# Patient Record
Sex: Female | Born: 1945 | Race: White | Hispanic: No | Smoking: Never smoker
Health system: Southern US, Community
[De-identification: ages and names within clinical notes are randomized; demographics above are authoritative.]

## PROBLEM LIST (undated history)

## (undated) DIAGNOSIS — F32A Depression, unspecified: Secondary | ICD-10-CM

## (undated) DIAGNOSIS — E538 Deficiency of other specified B group vitamins: Secondary | ICD-10-CM

## (undated) DIAGNOSIS — N289 Disorder of kidney and ureter, unspecified: Secondary | ICD-10-CM

## (undated) DIAGNOSIS — J45909 Unspecified asthma, uncomplicated: Secondary | ICD-10-CM

## (undated) DIAGNOSIS — E039 Hypothyroidism, unspecified: Secondary | ICD-10-CM

## (undated) DIAGNOSIS — M1990 Unspecified osteoarthritis, unspecified site: Secondary | ICD-10-CM

## (undated) DIAGNOSIS — F5089 Other specified eating disorder: Secondary | ICD-10-CM

## (undated) DIAGNOSIS — G5600 Carpal tunnel syndrome, unspecified upper limb: Secondary | ICD-10-CM

## (undated) DIAGNOSIS — E119 Type 2 diabetes mellitus without complications: Secondary | ICD-10-CM

## (undated) DIAGNOSIS — J189 Pneumonia, unspecified organism: Secondary | ICD-10-CM

## (undated) DIAGNOSIS — G4733 Obstructive sleep apnea (adult) (pediatric): Secondary | ICD-10-CM

## (undated) DIAGNOSIS — Z8719 Personal history of other diseases of the digestive system: Secondary | ICD-10-CM

## (undated) DIAGNOSIS — K644 Residual hemorrhoidal skin tags: Secondary | ICD-10-CM

## (undated) DIAGNOSIS — R011 Cardiac murmur, unspecified: Secondary | ICD-10-CM

## (undated) DIAGNOSIS — U071 COVID-19: Secondary | ICD-10-CM

## (undated) DIAGNOSIS — N189 Chronic kidney disease, unspecified: Secondary | ICD-10-CM

## (undated) DIAGNOSIS — Z9289 Personal history of other medical treatment: Secondary | ICD-10-CM

## (undated) DIAGNOSIS — I10 Essential (primary) hypertension: Secondary | ICD-10-CM

## (undated) DIAGNOSIS — D509 Iron deficiency anemia, unspecified: Secondary | ICD-10-CM

## (undated) DIAGNOSIS — E669 Obesity, unspecified: Secondary | ICD-10-CM

## (undated) DIAGNOSIS — F329 Major depressive disorder, single episode, unspecified: Secondary | ICD-10-CM

## (undated) DIAGNOSIS — K2970 Gastritis, unspecified, without bleeding: Secondary | ICD-10-CM

## (undated) DIAGNOSIS — E785 Hyperlipidemia, unspecified: Secondary | ICD-10-CM

## (undated) HISTORY — DX: Obstructive sleep apnea (adult) (pediatric): G47.33

## (undated) HISTORY — DX: Unspecified osteoarthritis, unspecified site: M19.90

## (undated) HISTORY — DX: Essential (primary) hypertension: I10

## (undated) HISTORY — PX: TONSILLECTOMY: SUR1361

## (undated) HISTORY — DX: Type 2 diabetes mellitus without complications: E11.9

## (undated) HISTORY — DX: Carpal tunnel syndrome, unspecified upper limb: G56.00

## (undated) HISTORY — DX: Iron deficiency anemia, unspecified: D50.9

## (undated) HISTORY — DX: Other specified eating disorder: F50.89

## (undated) HISTORY — PX: ABDOMINAL HYSTERECTOMY: SHX81

## (undated) HISTORY — DX: Hyperlipidemia, unspecified: E78.5

## (undated) HISTORY — DX: Disorder of kidney and ureter, unspecified: N28.9

## (undated) HISTORY — DX: Deficiency of other specified B group vitamins: E53.8

## (undated) HISTORY — PX: APPENDECTOMY: SHX54

## (undated) HISTORY — PX: HEMORRHOID SURGERY: SHX153

## (undated) HISTORY — PX: ADENOIDECTOMY: SUR15

## (undated) HISTORY — DX: Residual hemorrhoidal skin tags: K64.4

## (undated) HISTORY — DX: Major depressive disorder, single episode, unspecified: F32.9

## (undated) HISTORY — PX: ESOPHAGOGASTRODUODENOSCOPY: SHX1529

## (undated) HISTORY — DX: Obesity, unspecified: E66.9

## (undated) HISTORY — PX: COLONOSCOPY: SHX174

## (undated) HISTORY — DX: Gastritis, unspecified, without bleeding: K29.70

## (undated) HISTORY — DX: Depression, unspecified: F32.A

## (undated) HISTORY — DX: Personal history of other diseases of the digestive system: Z87.19

## (undated) HISTORY — DX: Hypothyroidism, unspecified: E03.9

---

## 2000-09-05 ENCOUNTER — Encounter: Admission: RE | Admit: 2000-09-05 | Discharge: 2000-12-04 | Payer: Self-pay | Admitting: Endocrinology

## 2003-07-02 ENCOUNTER — Other Ambulatory Visit: Admission: RE | Admit: 2003-07-02 | Discharge: 2003-07-02 | Payer: Self-pay | Admitting: Endocrinology

## 2005-09-28 ENCOUNTER — Other Ambulatory Visit: Admission: RE | Admit: 2005-09-28 | Discharge: 2005-09-28 | Payer: Self-pay | Admitting: Internal Medicine

## 2005-10-19 ENCOUNTER — Encounter: Admission: RE | Admit: 2005-10-19 | Discharge: 2005-10-19 | Payer: Self-pay | Admitting: Endocrinology

## 2005-11-01 ENCOUNTER — Ambulatory Visit (HOSPITAL_COMMUNITY): Admission: RE | Admit: 2005-11-01 | Discharge: 2005-11-01 | Payer: Self-pay | Admitting: *Deleted

## 2005-11-08 ENCOUNTER — Encounter: Admission: RE | Admit: 2005-11-08 | Discharge: 2005-11-08 | Payer: Self-pay | Admitting: Endocrinology

## 2006-07-13 ENCOUNTER — Encounter: Admission: RE | Admit: 2006-07-13 | Discharge: 2006-07-13 | Payer: Self-pay | Admitting: Endocrinology

## 2006-10-31 ENCOUNTER — Encounter: Admission: RE | Admit: 2006-10-31 | Discharge: 2006-10-31 | Payer: Self-pay | Admitting: Endocrinology

## 2006-11-16 ENCOUNTER — Other Ambulatory Visit: Admission: RE | Admit: 2006-11-16 | Discharge: 2006-11-16 | Payer: Self-pay | Admitting: Cardiology

## 2007-02-23 ENCOUNTER — Inpatient Hospital Stay (HOSPITAL_COMMUNITY): Admission: AD | Admit: 2007-02-23 | Discharge: 2007-02-23 | Payer: Self-pay | Admitting: Endocrinology

## 2007-03-14 ENCOUNTER — Ambulatory Visit (HOSPITAL_COMMUNITY): Admission: RE | Admit: 2007-03-14 | Discharge: 2007-03-14 | Payer: Self-pay | Admitting: *Deleted

## 2007-08-02 ENCOUNTER — Encounter (HOSPITAL_COMMUNITY): Admission: RE | Admit: 2007-08-02 | Discharge: 2007-08-02 | Payer: Self-pay | Admitting: Endocrinology

## 2007-08-02 ENCOUNTER — Observation Stay (HOSPITAL_COMMUNITY): Admission: AD | Admit: 2007-08-02 | Discharge: 2007-08-04 | Payer: Self-pay | Admitting: Endocrinology

## 2008-02-21 ENCOUNTER — Encounter (HOSPITAL_COMMUNITY): Admission: RE | Admit: 2008-02-21 | Discharge: 2008-05-06 | Payer: Self-pay | Admitting: Endocrinology

## 2008-03-19 ENCOUNTER — Encounter (INDEPENDENT_AMBULATORY_CARE_PROVIDER_SITE_OTHER): Payer: Self-pay | Admitting: *Deleted

## 2008-03-19 ENCOUNTER — Ambulatory Visit (HOSPITAL_COMMUNITY): Admission: RE | Admit: 2008-03-19 | Discharge: 2008-03-19 | Payer: Self-pay | Admitting: *Deleted

## 2008-09-27 DIAGNOSIS — Z8719 Personal history of other diseases of the digestive system: Secondary | ICD-10-CM

## 2008-09-27 HISTORY — DX: Personal history of other diseases of the digestive system: Z87.19

## 2008-10-09 ENCOUNTER — Inpatient Hospital Stay (HOSPITAL_COMMUNITY): Admission: EM | Admit: 2008-10-09 | Discharge: 2008-10-12 | Payer: Self-pay | Admitting: Emergency Medicine

## 2008-10-11 ENCOUNTER — Encounter (INDEPENDENT_AMBULATORY_CARE_PROVIDER_SITE_OTHER): Payer: Self-pay | Admitting: *Deleted

## 2008-10-12 ENCOUNTER — Ambulatory Visit: Payer: Self-pay | Admitting: Internal Medicine

## 2009-03-10 ENCOUNTER — Other Ambulatory Visit: Admission: RE | Admit: 2009-03-10 | Discharge: 2009-03-10 | Payer: Self-pay | Admitting: Internal Medicine

## 2009-10-11 ENCOUNTER — Encounter (INDEPENDENT_AMBULATORY_CARE_PROVIDER_SITE_OTHER): Payer: Self-pay | Admitting: *Deleted

## 2009-10-12 ENCOUNTER — Encounter (INDEPENDENT_AMBULATORY_CARE_PROVIDER_SITE_OTHER): Payer: Self-pay | Admitting: *Deleted

## 2010-05-18 ENCOUNTER — Encounter: Payer: Self-pay | Admitting: Gastroenterology

## 2010-05-20 ENCOUNTER — Encounter (INDEPENDENT_AMBULATORY_CARE_PROVIDER_SITE_OTHER): Payer: Self-pay | Admitting: *Deleted

## 2010-05-26 ENCOUNTER — Ambulatory Visit: Payer: Self-pay | Admitting: Oncology

## 2010-06-02 LAB — CBC & DIFF AND RETIC
BASO%: 0.4 % (ref 0.0–2.0)
Basophils Absolute: 0 10*3/uL (ref 0.0–0.1)
Eosinophils Absolute: 0.1 10*3/uL (ref 0.0–0.5)
LYMPH%: 25.4 % (ref 14.0–49.7)
MCHC: 29.6 g/dL — ABNORMAL LOW (ref 31.5–36.0)
MONO#: 0.6 10*3/uL (ref 0.1–0.9)
NEUT%: 62.1 % (ref 38.4–76.8)
Retic %: 2.21 % — ABNORMAL HIGH (ref 0.50–1.50)
Retic Ct Abs: 70.94 10*3/uL (ref 18.30–72.70)
lymph#: 1.5 10*3/uL (ref 0.9–3.3)

## 2010-06-02 LAB — MORPHOLOGY: PLT EST: INCREASED

## 2010-06-02 LAB — CHCC SMEAR

## 2010-06-03 LAB — COMPREHENSIVE METABOLIC PANEL
ALT: 19 U/L (ref 0–35)
Albumin: 4.1 g/dL (ref 3.5–5.2)
CO2: 21 mEq/L (ref 19–32)
Calcium: 9.7 mg/dL (ref 8.4–10.5)
Creatinine, Ser: 1.35 mg/dL — ABNORMAL HIGH (ref 0.40–1.20)
Total Bilirubin: 0.3 mg/dL (ref 0.3–1.2)
Total Protein: 6.5 g/dL (ref 6.0–8.3)

## 2010-06-03 LAB — IRON AND TIBC: TIBC: 391 ug/dL (ref 250–470)

## 2010-06-03 LAB — FERRITIN: Ferritin: 11 ng/mL (ref 10–291)

## 2010-06-12 LAB — FECAL OCCULT BLOOD, GUAIAC: Occult Blood: NEGATIVE

## 2010-07-09 ENCOUNTER — Ambulatory Visit: Payer: Self-pay | Admitting: Oncology

## 2010-07-13 ENCOUNTER — Encounter: Payer: Self-pay | Admitting: Gastroenterology

## 2010-07-13 LAB — CBC WITH DIFFERENTIAL/PLATELET
Basophils Absolute: 0 10*3/uL (ref 0.0–0.1)
Eosinophils Absolute: 0.2 10*3/uL (ref 0.0–0.5)
HCT: 30.2 % — ABNORMAL LOW (ref 34.8–46.6)
MCHC: 31.5 g/dL (ref 31.5–36.0)
MONO#: 0.6 10*3/uL (ref 0.1–0.9)
MONO%: 8.9 % (ref 0.0–14.0)
Platelets: ADEQUATE 10*3/uL (ref 145–400)
lymph#: 1.5 10*3/uL (ref 0.9–3.3)
nRBC: 0 % (ref 0–0)

## 2010-07-15 LAB — IRON AND TIBC
%SAT: 12 % — ABNORMAL LOW (ref 20–55)
TIBC: 298 ug/dL (ref 250–470)
UIBC: 261 ug/dL

## 2010-07-17 DIAGNOSIS — G4730 Sleep apnea, unspecified: Secondary | ICD-10-CM | POA: Insufficient documentation

## 2010-07-17 DIAGNOSIS — M1990 Unspecified osteoarthritis, unspecified site: Secondary | ICD-10-CM | POA: Insufficient documentation

## 2010-07-17 DIAGNOSIS — G5600 Carpal tunnel syndrome, unspecified upper limb: Secondary | ICD-10-CM

## 2010-07-17 DIAGNOSIS — D509 Iron deficiency anemia, unspecified: Secondary | ICD-10-CM | POA: Insufficient documentation

## 2010-07-17 DIAGNOSIS — E538 Deficiency of other specified B group vitamins: Secondary | ICD-10-CM

## 2010-07-17 DIAGNOSIS — K648 Other hemorrhoids: Secondary | ICD-10-CM | POA: Insufficient documentation

## 2010-07-17 DIAGNOSIS — E119 Type 2 diabetes mellitus without complications: Secondary | ICD-10-CM | POA: Insufficient documentation

## 2010-07-17 DIAGNOSIS — K922 Gastrointestinal hemorrhage, unspecified: Secondary | ICD-10-CM | POA: Insufficient documentation

## 2010-07-17 DIAGNOSIS — E039 Hypothyroidism, unspecified: Secondary | ICD-10-CM | POA: Insufficient documentation

## 2010-07-21 ENCOUNTER — Ambulatory Visit: Payer: Self-pay | Admitting: Gastroenterology

## 2010-07-21 DIAGNOSIS — I10 Essential (primary) hypertension: Secondary | ICD-10-CM | POA: Insufficient documentation

## 2010-07-21 DIAGNOSIS — K625 Hemorrhage of anus and rectum: Secondary | ICD-10-CM | POA: Insufficient documentation

## 2010-07-21 DIAGNOSIS — F329 Major depressive disorder, single episode, unspecified: Secondary | ICD-10-CM | POA: Insufficient documentation

## 2010-07-21 DIAGNOSIS — K644 Residual hemorrhoidal skin tags: Secondary | ICD-10-CM | POA: Insufficient documentation

## 2010-07-21 DIAGNOSIS — J45909 Unspecified asthma, uncomplicated: Secondary | ICD-10-CM | POA: Insufficient documentation

## 2010-07-21 DIAGNOSIS — F411 Generalized anxiety disorder: Secondary | ICD-10-CM | POA: Insufficient documentation

## 2010-07-21 DIAGNOSIS — E785 Hyperlipidemia, unspecified: Secondary | ICD-10-CM

## 2010-08-05 LAB — CBC WITH DIFFERENTIAL/PLATELET
Basophils Absolute: 0 10*3/uL (ref 0.0–0.1)
Eosinophils Absolute: 0.2 10*3/uL (ref 0.0–0.5)
HCT: 30.7 % — ABNORMAL LOW (ref 34.8–46.6)
MCH: 33.1 pg (ref 25.1–34.0)
MCV: 99.6 fL (ref 79.5–101.0)
MONO#: 0.7 10*3/uL (ref 0.1–0.9)
RBC: 3.08 10*6/uL — ABNORMAL LOW (ref 3.70–5.45)
RDW: 24.6 % — ABNORMAL HIGH (ref 11.2–14.5)
lymph#: 1.4 10*3/uL (ref 0.9–3.3)

## 2010-08-12 ENCOUNTER — Ambulatory Visit: Payer: Self-pay | Admitting: Oncology

## 2010-08-13 ENCOUNTER — Encounter: Payer: Self-pay | Admitting: Gastroenterology

## 2010-08-18 ENCOUNTER — Encounter: Payer: Self-pay | Admitting: Gastroenterology

## 2010-08-18 LAB — CBC WITH DIFFERENTIAL/PLATELET
Basophils Absolute: 0 10*3/uL (ref 0.0–0.1)
HGB: 11.8 g/dL (ref 11.6–15.9)
LYMPH%: 22.9 % (ref 14.0–49.7)
MCHC: 34.2 g/dL (ref 31.5–36.0)
MONO#: 0.8 10*3/uL (ref 0.1–0.9)
MONO%: 13.9 % (ref 0.0–14.0)
NEUT#: 3.2 10*3/uL (ref 1.5–6.5)
Platelets: 261 10*3/uL (ref 145–400)
RBC: 3.47 10*6/uL — ABNORMAL LOW (ref 3.70–5.45)
RDW: 19.3 % — ABNORMAL HIGH (ref 11.2–14.5)
WBC: 5.6 10*3/uL (ref 3.9–10.3)

## 2010-08-18 LAB — COMPREHENSIVE METABOLIC PANEL
ALT: 17 U/L (ref 0–35)
Albumin: 3.6 g/dL (ref 3.5–5.2)
Creatinine, Ser: 2.08 mg/dL — ABNORMAL HIGH (ref 0.40–1.20)
Glucose, Bld: 88 mg/dL (ref 70–99)
Potassium: 3.9 mEq/L (ref 3.5–5.3)
Total Protein: 6.7 g/dL (ref 6.0–8.3)

## 2010-08-19 LAB — IRON AND TIBC
TIBC: 247 ug/dL — ABNORMAL LOW (ref 250–470)
UIBC: 213 ug/dL

## 2010-08-19 LAB — FERRITIN: Ferritin: 232 ng/mL (ref 10–291)

## 2010-08-19 LAB — TRANSFERRIN RECEPTOR, SOLUABLE: Transferrin Receptor, Soluble: 30.6 nmol/L

## 2010-09-04 LAB — CBC WITH DIFFERENTIAL/PLATELET
Basophils Absolute: 0 10*3/uL (ref 0.0–0.1)
EOS%: 5.1 % (ref 0.0–7.0)
HCT: 35.2 % (ref 34.8–46.6)
HGB: 12.1 g/dL (ref 11.6–15.9)
LYMPH%: 20.9 % (ref 14.0–49.7)
MCH: 34.6 pg — ABNORMAL HIGH (ref 25.1–34.0)
MCHC: 34.5 g/dL (ref 31.5–36.0)
MONO#: 0.4 10*3/uL (ref 0.1–0.9)
NEUT%: 66.7 % (ref 38.4–76.8)
Platelets: 274 10*3/uL (ref 145–400)
RBC: 3.51 10*6/uL — ABNORMAL LOW (ref 3.70–5.45)
RDW: 17.4 % — ABNORMAL HIGH (ref 11.2–14.5)
lymph#: 1.2 10*3/uL (ref 0.9–3.3)

## 2010-09-04 LAB — FERRITIN: Ferritin: 272 ng/mL (ref 10–291)

## 2010-09-17 ENCOUNTER — Ambulatory Visit (HOSPITAL_COMMUNITY)
Admission: RE | Admit: 2010-09-17 | Discharge: 2010-09-18 | Payer: Self-pay | Source: Home / Self Care | Attending: General Surgery | Admitting: General Surgery

## 2010-09-18 ENCOUNTER — Ambulatory Visit: Payer: Self-pay | Admitting: Oncology

## 2010-09-23 LAB — CBC WITH DIFFERENTIAL/PLATELET
MCHC: 33 g/dL (ref 31.5–36.0)
MONO#: 0.6 10*3/uL (ref 0.1–0.9)
NEUT#: 7.5 10*3/uL — ABNORMAL HIGH (ref 1.5–6.5)
NEUT%: 77.1 % — ABNORMAL HIGH (ref 38.4–76.8)
RDW: 15.5 % — ABNORMAL HIGH (ref 11.2–14.5)
lymph#: 1.2 10*3/uL (ref 0.9–3.3)

## 2010-09-24 LAB — FERRITIN: Ferritin: 116 ng/mL (ref 10–291)

## 2010-10-08 ENCOUNTER — Encounter: Payer: Self-pay | Admitting: Gastroenterology

## 2010-10-09 ENCOUNTER — Encounter: Payer: Self-pay | Admitting: Gastroenterology

## 2010-10-09 LAB — COMPREHENSIVE METABOLIC PANEL
ALT: 20 U/L (ref 0–35)
AST: 18 U/L (ref 0–37)
Albumin: 3.6 g/dL (ref 3.5–5.2)
Alkaline Phosphatase: 65 U/L (ref 39–117)
BUN: 16 mg/dL (ref 6–23)
CO2: 29 mEq/L (ref 19–32)
Calcium: 9.5 mg/dL (ref 8.4–10.5)
Chloride: 106 mEq/L (ref 96–112)
Creatinine, Ser: 1.3 mg/dL — ABNORMAL HIGH (ref 0.40–1.20)
Glucose, Bld: 135 mg/dL — ABNORMAL HIGH (ref 70–99)
Potassium: 4.6 mEq/L (ref 3.5–5.3)
Sodium: 144 mEq/L (ref 135–145)
Total Bilirubin: 0.4 mg/dL (ref 0.3–1.2)
Total Protein: 6.6 g/dL (ref 6.0–8.3)

## 2010-10-09 LAB — IRON AND TIBC
%SAT: 26 % (ref 20–55)
Iron: 64 ug/dL (ref 42–145)
TIBC: 243 ug/dL — ABNORMAL LOW (ref 250–470)
UIBC: 179 ug/dL

## 2010-10-09 LAB — CBC WITH DIFFERENTIAL/PLATELET
BASO%: 0.5 % (ref 0.0–2.0)
Basophils Absolute: 0 10*3/uL (ref 0.0–0.1)
EOS%: 2.7 % (ref 0.0–7.0)
Eosinophils Absolute: 0.2 10*3/uL (ref 0.0–0.5)
HCT: 36.7 % (ref 34.8–46.6)
HGB: 12.6 g/dL (ref 11.6–15.9)
LYMPH%: 21.5 % (ref 14.0–49.7)
MCH: 34.1 pg — ABNORMAL HIGH (ref 25.1–34.0)
MCHC: 34.3 g/dL (ref 31.5–36.0)
MCV: 99.5 fL (ref 79.5–101.0)
MONO#: 0.5 10*3/uL (ref 0.1–0.9)
MONO%: 7.1 % (ref 0.0–14.0)
NEUT#: 5 10*3/uL (ref 1.5–6.5)
NEUT%: 68.2 % (ref 38.4–76.8)
Platelets: 352 10*3/uL (ref 145–400)
RBC: 3.69 10*6/uL — ABNORMAL LOW (ref 3.70–5.45)
RDW: 14.6 % — ABNORMAL HIGH (ref 11.2–14.5)
WBC: 7.3 10*3/uL (ref 3.9–10.3)
lymph#: 1.6 10*3/uL (ref 0.9–3.3)

## 2010-10-09 LAB — FERRITIN: Ferritin: 91 ng/mL (ref 10–291)

## 2010-10-09 LAB — LACTATE DEHYDROGENASE: LDH: 131 U/L (ref 94–250)

## 2010-10-27 NOTE — Letter (Signed)
Summary: New Patient letter  [facility]  [address] [place], [place] [address]   Phone: [phone]  Fax: [phone]       05/20/2010 MRN: [id number]  [name],   [id number]  Dear [name],  Welcome to the Gastroenterology Division at [facility].    You are scheduled to see Dr. [name] on 07/21/2010 at 2:00pm on the 3rd floor at [facility] [place] [facility].  We ask that you try to arrive at our office 15 minutes prior to your appointment time to allow for check-in.  We would like you to complete the enclosed self-administered evaluation form prior to your visit and bring it with you on the day of your appointment.  We will review it with you.  Also, please bring a complete list of all your medications or, if you prefer, bring the medication bottles and we will list them.  Please bring your insurance card so that we may make a copy of it.  If your insurance requires a referral to see a specialist, please bring your referral form from your primary care physician.  Co-payments are due at the time of your visit and may be paid by cash, check or credit card.     Your office visit will consist of a consult with your physician (includes a physical exam), any laboratory testing he/she may order, scheduling of any necessary diagnostic testing (e.g. x-ray, ultrasound, CT-scan), and scheduling of a procedure (e.g. Endoscopy, Colonoscopy) if required.  Please allow enough time on your schedule to allow for any/all of these possibilities.    If you cannot keep your appointment, please call [phone] to cancel or reschedule prior to your appointment date.  This allows [place] the opportunity to schedule an appointment for another patient in need of care.  If you do not cancel or reschedule by 5 p.m. the business day prior to your appointment date, you will be charged a $50.00 late cancellation/no-show fee.    Thank you for choosing  [facility] for your medical needs.  We appreciate the opportunity to care for you.  Please visit [place] at our website  to learn more about our practice.                     Sincerely,                                                             The Gastroenterology Division

## 2010-10-27 NOTE — Procedures (Signed)
Summary: Colonoscopy & Pathology   Colonoscopy  Procedure date:  10/11/2008  Findings:      Location:  [facility].   NAME:  [name], [name]               ACCOUNT NO.:  [id number]      MEDICAL RECORD NO.:  [id number]          PATIENT TYPE:  INP      LOCATION:  5118                         FACILITY:  [facility]      PHYSICIAN:  Waverly Ferrari, M.D.    DATE OF BIRTH:  1945/10/05      DATE OF PROCEDURE:  10/09/2008   DATE OF DISCHARGE:                                  OPERATIVE REPORT      PROCEDURE:  Colonoscopy.      INDICATION:  Rectal bleeding with anemia.      ANESTHESIA:  Fentanyl 15 mcg, Versed 5 mg.      PROCEDURE:  With the patient mildly sedated in the left lateral   decubitus position, the rectal exam was performed.  It was unremarkable.   Subsequently, a Pentax videoscopic colonoscope was inserted in the   rectum and passed under direct vision with pressure applied.  The   patient rolled to her back, we were able to reach the cecum identified   by base of cecum and ileocecal valve, both of which were photographed.   From this point, the colonoscope was slowly withdrawn, taken   circumferential views of colonic mucosa and stopping to photograph.   White patches seen scattered throughout the colon in the mucosa and   biopsied these areas in multiple places as well.  The endoscope was   withdrawn and all the way to the rectum which appeared normal in direct   and retroflex view.  Pullthrough the anal canal, however, showed that   she had significant external hemorrhoids that were bleeding.   Photographs taken.  The patient's vital signs and pulse oximetry   remained stable.  The patient tolerated the procedure well without   apparent complications.      FINDINGS:  White patches in colonic mucosa, etiology which is unclear.   Biopsies taken and had significant external hemorrhoids that were   bleeding and quite possibly could be the cause of the patient's blood   loss.      PLAN:  We will await biopsy report.  If the patient is stable, may be   discharged with outpatient followup, will probably need an external   hemorrhoidectomy.                  ______________________________   Waverly Ferrari, M.D.            GMO/MEDQ  D:  10/11/2008  T:  10/12/2008  Job:  [id number]    SP-Surgical Pathology - STATUS: Final  .                                         Perform Date: [id number] 00:01  Ordered By: Macario Golds,  Ordered Date: [id number] 15:53  Facility: [facility]                              Department: [place]  Service Report Text  [facility]   [address]   [place], [place] [address]   [phone]    REPORT OF SURGICAL PATHOLOGY    Case #: [id number]   Patient Name: [name].   Office Chart Number: N/A    MRN: [id number]   Pathologist: Neldon Mc, MD   DOB/Age 11-06-1945 (Age: 65) Gender: F   Date Taken: 10/11/2008   Date Received: 10/11/2008    FINAL DIAGNOSIS    ***MICROSCOPIC EXAMINATION AND DIAGNOSIS***    1. STOMACH: BENIGN GASTRIC MUCOSA WITH SLIGHT CHRONIC   INFLAMMATION. NO HELICOBACTER PYLORI, INTESTINAL METAPLASIA, OR   EVIDENCE OF MALIGNANCY IDENTIFIED (BIOPSIES, ANTRUM).    2. COLON: BENIGN COLORECTAL MUCOSA WITH MINIMAL ACTIVE   INFLAMMATION AND FEATURES OF MELANOSIS COLI (BIOPSIES,   ASCENDING).    COMMENT   1. A Warthin-Starry stain is performed to determine the   possibility of the presence of Helicobacter pylori. The   Warthin-Starry stain is negative for organisms of Helicobacter   pylori. The control(s) stained appropriately. There is benign   gastric mucosa with sparse chronic inflammation within the lamina   propria. No active inflammation, intestinal metaplasia or   evidence of malignancy is seen.    2. There are fragments of benign colorectal mucosa in which   there is prominent melanosis coli present. There is mild to   moderate chronic inflammation within  the lamina propria   associated with a rare crypt containing an eosinophil or   neutrophil within crypt epithelium consistent with minimal   cryptitis. I suspect the findings are nonspecific since they are   minimal and no other active inflammation is seen. There are   hyperplastic mucosal lymphoid aggregates present. No adenomatous   change or evidence of malignancy is seen. (JAS:kv 10-14-08)    kv   Date Reported: 10/14/2008 Neldon Mc, MD   *** Electronically Signed Out By JAS ***    Clinical information   (lmb)    specimen(s) obtained   1: Stomach, biopsy, antral   2: Colon, biopsy, ascending    Gross Description   1. Received in formalin are tan, soft tissue fragments that are   submitted in toto. Number: 2 Size: 0.2 cm   Block summary: toto in cassette    2. Received in formalin are four 0.1 to 0.2 cm fragments of   soft, tan material. Block summary: toto in cassette   (BM:jes,10/14/08)    jes/   Additional Information  HL7 RESULT STATUS : F  External IF Update Timestamp : 2008-10-11:15:57:00.000000

## 2010-10-27 NOTE — Letter (Signed)
Summary: New Patient letter  [facility]  [address] [place], [place] [address]   Phone: [phone]  Fax: [phone]       05/18/2010 MRN: [id number]  [place], [place]  [address]  Dear Ms. Makris,  Welcome to the Gastroenterology Division at [facility].    You are scheduled to see Dr.  [name] on 07-14-10 at 9am on the 3rd floor at [facility] [place] [facility].  We ask that you try to arrive at our office 15 minutes prior to your appointment time to allow for check-in.  We would like you to complete the enclosed self-administered evaluation form prior to your visit and bring it with you on the day of your appointment.  We will review it with you.  Also, please bring a complete list of all your medications or, if you prefer, bring the medication bottles and we will list them.  Please bring your insurance card so that we may make a copy of it.  If your insurance requires a referral to see a specialist, please bring your referral form from your primary care physician.  Co-payments are due at the time of your visit and may be paid by cash, check or credit card.     Your office visit will consist of a consult with your physician (includes a physical exam), any laboratory testing he/she may order, scheduling of any necessary diagnostic testing (e.g. x-ray, ultrasound, CT-scan), and scheduling of a procedure (e.g. Endoscopy, Colonoscopy) if required.  Please allow enough time on your schedule to allow for any/all of these possibilities.    If you cannot keep your appointment, please call [phone] to cancel or reschedule prior to your appointment date.  This allows [place] the opportunity to schedule an appointment for another patient in need of care.  If you do not cancel or reschedule by 5 p.m. the business day prior to your appointment date, you will be charged a $50.00 late cancellation/no-show fee.    Thank you for choosing [facility] for your medical needs.  We appreciate the opportunity to care for you.  Please visit [place] at our website  to learn more about our practice.                     Sincerely,                                                             The Gastroenterology Division

## 2010-10-27 NOTE — Discharge Summary (Signed)
NAMEBRAVE, MUNI               ACCOUNT NO.:  [id number]      MEDICAL RECORD NO.:  [id number]          PATIENT TYPE:  INP      LOCATION:  5118                         FACILITY:  [facility]      PHYSICIAN:  Cherene Altes, M.D.DATE OF BIRTH:  [birth year]      DATE OF ADMISSION:  10/09/2008   DATE OF DISCHARGE:  10/12/2008                                  DISCHARGE SUMMARY      PRIMARY CARE PHYSICIAN:  Ronaldo Miyamoto, M.D.      GI PHYSICIAN:  Waverly Ferrari, M.D.      DISCHARGE DIAGNOSES:   1. Acute/chronic blood loss anemia.       a.     Hemoglobin 6.8 at presentation.       b.     Status post transfusion of 3 units of packed red blood        cells.       c.     Hemoglobin 9.4 at discharge.   2. Gastrointestinal bleeding.       a.     Status post esophagogastroduodenoscopy and colonoscopy.       b.     External hemorrhoids which were actively bleeding noted on        colonoscopy.       c.     Gastric and colonic biopsies pending at discharge.   3. Severe B12 deficiency.       a.     Newly-diagnosed.       b.     B12 186 during hospital stay.       c.     Subcutaneous B12 administered during hospital stay.       d.     Outpatient trial of oral B12 with recommendation for recheck        of B12 level in 7-10 days.   4. Diabetes mellitus.   5. Osteoarthritis.   6. Carpal tunnel syndrome.   7. obstructive sleep apnea.   8. Hypothyroidism.   9. Status post hysterectomy.   10.Status post appendectomy.   11.Status post tonsillectomy and adenoidectomy.      DISCHARGE MEDICATIONS:   1. Benicar 40/25 p.o. daily.   2. Detrol 4 mg p.o. daily.   3. Metformin 1000 mg p.o. b.i.d.   4. Vytorin 10/40 p.o. daily.   5. Synthroid 100 mcg p.o. daily   6. Januvia 100 mg p.o. daily   7. Ranitidine 150 mg p.o. daily or Protonix 40 mg p.o. daily or       omeprazole 20 mg p.o. daily.   8. MiraLax or Benaprotein fiber supplement.   9. Iron sulfate 325 mg p.o. b.i.d. with food.   10.Vitamin B12  1000 mcg tablets 2 tablets daily until further notice.      FOLLOWUP:   1. The patient is instructed to call Dr. Shan Levans office for biopsy       results and for further followup recommendations.   2. The patient is advised to follow up with Dr. Wilson Singer in 7-10 days.  At that time a B12 level should be assessed to determine if oral       replacement therapy will suffice for the patient's B12 deficiency.       It is quite possible that she suffers with a malabsorption problem       and will require frequent injections.      CONSULTATION:  Dr. Jim Desanctis with gastroenterology.      PROCEDURE:   1. EGD October 10, 2008:  Gastric biopsies but no acute abnormalities       appreciated.   2. Colonoscopy October 10, 2008:  Multiple white patches throughout       the colon with external bleeding hemorrhoids.  White patches were       biopsied with results pending.      HOSPITAL COURSE:  Ms. Arliss Koep is a very pleasant 65 year old   female who presented with a history of severe weakness.  Workup in the   emergency room included a CBC which revealed a hemoglobin of 6.8.  The   patient was found to be guaiac-positive.  The patient was admitted to   the acute units.  She was transfused a total of 3 units of packed red   blood cells.  Gastroenterology consultation was carried out.  The   patient underwent a colonoscopy and EGD with the results as noted above.   As part of the patient's evaluation, a routine screening B12 was also   performed.  This returned remarkably low as noted above.  Subcu B12   injections at 1000 mcg per day were initiated for total of 2 doses   during the patient's hospital stay.  At the time of discharge it is felt   reasonable to attempt an outpatient oral treatment regimen for B12   deficiency as described above.  It is recognized the patient may be   suffering with a malabsorption issue and if the patient's B12 is not   sufficiently elevated within 10-14 days max,  it is likely that recurrent   outpatient B12 injections will be required.  In regard to the patient's   anemia, her blood count responded nicely to her transfusion.  Her   hemoglobin reached a peak of approximately 9.4 and remained stable   through the remainder of her hospitalization.  Colonoscopy did reveal   evidence of external hemorrhoids and this is felt to likely be the   primary cause of the patient's blood loss anemia.  A number of white   patches were noted on the colonoscopy and these were sent for   pathology.  The results are pending and the patient is instructed to   call Dr. Lajoyce Corners for a final diagnosis.  Otherwise, biopsies were also made   of the gastric mucosa and these too are pending at the present time.   Nonetheless, on October 12, 2008, the patient was tolerating a regular   diet without difficulty.  She was ambulating around her room reported   that her severe weakness was markedly improved.  She was therefore   cleared for discharge home with the above-listed followup   recommendations.               Cherene Altes, M.D.   Electronically Signed            JTM/MEDQ  D:  10/12/2008  T:  10/12/2008  Job:  [id number]      cc:   Ronaldo Miyamoto, M.D.   Ortencia Kick.  Lajoyce Corners, M.D.

## 2010-10-27 NOTE — Procedures (Signed)
Summary: Endoscopy & Pathology   EGD  Procedure date:  10/11/2009  Findings:      Location: [facility]   NAME:  [name], [name]               ACCOUNT NO.:  [id number]      MEDICAL RECORD NO.:  [id number]          PATIENT TYPE:  INP      LOCATION:  5118                         FACILITY:  [facility]      PHYSICIAN:  Waverly Ferrari, M.D.    DATE OF BIRTH:  1945/12/28      DATE OF PROCEDURE:   DATE OF DISCHARGE:                                  OPERATIVE REPORT      PROCEDURE:  Upper endoscopy.      INDICATIONS:  GI bleeding.      ANESTHESIA:  Fentanyl, 100 mcg.  Versed 10 mg.      DESCRIPTION OF PROCEDURE:  With the patient mildly sedated in the left   lateral decubitus position, the Pentax videoscopic endoscope was   inserted in the mouth and passed under direct vision through the   esophagus, which appeared normal in the stomach, fundus body, antrum,   duodenal bulb, and second portion of duodenum were visualized.  From   this point, the endoscope was slowly withdrawn, taking circumferential   views of duodenal mucosa until the endoscope had been pulled back into   the stomach, placed in retroflexion to view the stomach from below, and   then the cardia of the stomach near the GE junction, is a linear area of   gastritis which was seen, photographed, and biopsied.  There might have   been some flecks of blood in the stomach, accounted for possible some of   the patient's blood loss, but not acutely.  The endoscope was then   straightened and withdrawn, taking circumferentially with the remaining   gastric and esophageal mucosa.  The patient's vital signs and pulse   oximetry remained stable.  The patient tolerated the procedure well   without apparent complications.      FINDINGS:  Local area of gastris in the fundus of the stomach, biopsied.   Await biopsy report.  The patient will call me for results and followup   with me as an outpatient.      PROCEDURE:   Colonoscopy as planned.                  ______________________________   Waverly Ferrari, M.D.            GMO/MEDQ  D:  10/11/2008  T:  10/12/2008  Job:  [id number]    SP-Surgical Pathology - STATUS: Final  .                                         Perform Date: [id number] 00:01  Ordered By: Macario Golds,            Ordered Date: [phone] 15:53  Facility: [facility]  Department: CPATH  Service Report Text  [facility]   [address]   [place], [place] [address]   [phone]    REPORT OF SURGICAL PATHOLOGY    Case #: [id number]   Patient Name: [name], [name].   Office Chart Number: N/A    MRN: [id number]   Pathologist: Neldon Mc, MD   DOB/Age 13-Dec-1945 (Age: 65) Gender: F   Date Taken: 10/11/2008   Date Received: 10/11/2008    FINAL DIAGNOSIS    ***MICROSCOPIC EXAMINATION AND DIAGNOSIS***    1. STOMACH: BENIGN GASTRIC MUCOSA WITH SLIGHT CHRONIC   INFLAMMATION. NO HELICOBACTER PYLORI, INTESTINAL METAPLASIA, OR   EVIDENCE OF MALIGNANCY IDENTIFIED (BIOPSIES, ANTRUM).    2. COLON: BENIGN COLORECTAL MUCOSA WITH MINIMAL ACTIVE   INFLAMMATION AND FEATURES OF MELANOSIS COLI (BIOPSIES,   ASCENDING).    COMMENT   1. A Warthin-Starry stain is performed to determine the   possibility of the presence of Helicobacter pylori. The   Warthin-Starry stain is negative for organisms of Helicobacter   pylori. The control(s) stained appropriately. There is benign   gastric mucosa with sparse chronic inflammation within the lamina   propria. No active inflammation, intestinal metaplasia or   evidence of malignancy is seen.    2. There are fragments of benign colorectal mucosa in which   there is prominent melanosis coli present. There is mild to   moderate chronic inflammation within the lamina propria   associated with a rare crypt containing an eosinophil or   neutrophil within crypt epithelium consistent with minimal    cryptitis. I suspect the findings are nonspecific since they are   minimal and no other active inflammation is seen. There are   hyperplastic mucosal lymphoid aggregates present. No adenomatous   change or evidence of malignancy is seen. (JAS:kv 10-14-08)    kv   Date Reported: 10/14/2008 Neldon Mc, MD   *** Electronically Signed Out By JAS ***    Clinical information   (lmb)    specimen(s) obtained   1: Stomach, biopsy, antral   2: Colon, biopsy, ascending    Gross Description   1. Received in formalin are tan, soft tissue fragments that are   submitted in toto. Number: 2 Size: 0.2 cm   Block summary: toto in cassette    2. Received in formalin are four 0.1 to 0.2 cm fragments of   soft, tan material. Block summary: toto in cassette   (BM:jes,10/14/08)    jes/   Additional Information  HL7 RESULT STATUS : F  External IF Update Timestamp : 2008-10-11:15:57:00.000000

## 2010-10-27 NOTE — Letter (Signed)
Summary: [place]   Imported By: Phillis Knack 07/27/2010 09:15:14  _____________________________________________________________________  External Attachment:    Type:   Image     Comment:   External Document

## 2010-10-27 NOTE — Letter (Signed)
Summary: [name]   Imported By: Phillis Knack 08/27/2010 14:22:13  _____________________________________________________________________  External Attachment:    Type:   Image     Comment:   External Document

## 2010-10-27 NOTE — Assessment & Plan Note (Signed)
Summary: ANEMIA/YF   History of Present Illness Visit Type: consult Primary GI MD: Verl Blalock MD [place] Primary Provider: Anda Kraft, MD Chief Complaint: anemia, pt has had iron infusions and transfusions.  She is seeing Dr. Ralene Ok who has prescribed weekly iron infusions through November History of Present Illness:   Extremity Pleasant 65 year old Caucasian female with recurrent and persistent rectal bleeding with 2 colonoscopies and endoscopies previously by Dr.Orr which were reviewed today and were entirely normal except for evidence of melanosis coli. She has continued bright red blood per rectum despite no constipation, abdominal rectal pain. She had excision of a thrombosed hemorrhoid greater than 10 years ago by Dr. Rebekah Chesterfield. She gives no history of chronic malabsorption. She currently is on regular iron infusions every other week by Dr. Ralene Ok because of her refractory bleeding and iron deficiency. Family history is noncontributory.   GI Review of Systems    Reports acid reflux, bloating, and  weight loss.      Denies abdominal pain, belching, chest pain, dysphagia with liquids, dysphagia with solids, heartburn, loss of appetite, nausea, vomiting, vomiting blood, and  weight gain.      Reports constipation, hemorrhoids, and  rectal bleeding.     Denies anal fissure, black tarry stools, change in bowel habit, diarrhea, diverticulosis, fecal incontinence, heme positive stool, irritable bowel syndrome, jaundice, light color stool, liver problems, and  rectal pain. Preventive Screening-Counseling & Management  Alcohol-Tobacco     Smoking Status: quit      Drug Use:  no.      Current Medications (verified): 1)  Detrol La 4 Mg Xr24h-Cap (Tolterodine Tartrate) .... Take One By Mouth Once Daily 2)  Losartan Potassium-Hctz 50-12.5 Mg Tabs (Losartan Potassium-Hctz) .... Take 1 Tablet By Mouth Two Times A Day 3)  Januvia 100 Mg Tabs (Sitagliptin Phosphate) .... Take One By Mouth  Once Daily 4)  Metformin Hcl 500 Mg Tabs (Metformin Hcl) .... Take Four Tabs By Mouth Every Morning 5)  Vitamin B-12 1000 Mcg Tabs (Cyanocobalamin) .... Take One By Mouth Once Daily 6)  Ranitidine Hcl 150 Mg Caps (Ranitidine Hcl) .... Take One Op Once Daily 7)  Diphenhydramine Hcl 25 Mg Caps (Diphenhydramine Hcl) .... Take One By Mouth Two Times A Day 8)  Tylenol Extra Strength 500 Mg Tabs (Acetaminophen) .... Take 2 Tablets By Mouth Every Morning Then As Needed 9)  Senna 8.6 Mg Tabs (Sennosides) .... Take 9 Tablets After Every Meal 10)  Synthroid 100 Mcg Tabs (Levothyroxine Sodium) .... Take One By Mouth Once Daily 11)  Infed 50 Mg/ml Soln (Iron Dextran) .... Infuse Weekly-On Friday- Through November  Allergies (verified): No Known Drug Allergies  Past History:  Past medical, surgical, family and social histories (including risk factors) reviewed for relevance to current acute and chronic problems.  Past Medical History: Current Problems:  HYPERTENSION (ICD-401.9) HYPERLIPIDEMIA (ICD-272.4) DEPRESSION (ICD-311) ASTHMA (ICD-493.90) ANXIETY (ICD-300.00) SLEEP APNEA (ICD-780.57) CARPAL TUNNEL SYNDROME, BILATERAL (ICD-354.0) OSTEOARTHRITIS (ICD-715.90) VITAMIN B12 DEFICIENCY (ICD-266.2) HEMORRHOIDS, INTERNAL (ICD-455.0) GASTROINTESTINAL HEMORRHAGE (ICD-578.9) ANEMIA, IRON DEFICIENCY (ICD-280.9) HYPOTHYROIDISM (ICD-244.9) DIABETES MELLITUS (ICD-250.00)  Past Surgical History: Appendectomy Hysterectomy Tonsillectomy adenoidectomy Thyroid Nodules removed  Family History: Reviewed history and no changes required. No FH of Colon Cancer: Family History of Heart Disease: Mother's side of family  Social History: Reviewed history and no changes required. 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  Alcohol Use - no Illicit Drug Use - no Smoking Status:  quit Drug Use:  no  Review of Systems       The patient complains of arthritis/joint pain,  back pain, fatigue, muscle pains/cramps, shortness of breath, sleeping problems, swelling of feet/legs, and vision changes.  The patient denies allergy/sinus, anemia, anxiety-new, blood in urine, breast changes/lumps, confusion, cough, coughing up blood, depression-new, fainting, fever, headaches-new, hearing problems, heart murmur, heart rhythm changes, itching, menstrual pain, night sweats, nosebleeds, pregnancy symptoms, skin rash, sore throat, swollen lymph glands, thirst - excessive, urination - excessive, urination changes/pain, urine leakage, and voice change.    Vital Signs:  Patient profile:   65 year old female Height:      69 inches Weight:      275 pounds BMI:     40.76 Pulse rate:   84 / minute Pulse rhythm:   regular BP sitting:   100 / 56  (left arm) Cuff size:   large  Vitals Entered By: Abelino Derrick CMA Deborra Medina) (July 21, 2010 2:06 PM)  Physical Exam  General:  Well developed, well nourished, no acute distress.obese.   Head:  Normocephalic and atraumatic. Eyes:  PERRLA, no icterus. Lungs:  Clear throughout to auscultation. Heart:  Regular rate and rhythm; no murmurs, rubs,  or bruits. Abdomen:  Soft, nontender and nondistended. No masses, hepatosplenomegaly or hernias noted. Normal bowel sounds.obese.   Rectal:  Large 3 quadrant hemorrhoids present without fissures or fistulae. Rectal exam does not show masses or tenderness. There is no blood or stool in the rectal vault for testing. Extremities:  No clubbing, cyanosis, edema or deformities noted. Neurologic:  Alert and  oriented x4;  grossly normal neurologically. Psych:  Alert and cooperative. Normal mood and affect.   Impression & Recommendations:  Problem # 1:  HEMORRHOIDS, EXTERNAL (ICD-455.3) Assessment Deteriorated I am surprised she has not been referred for hemorrhoidectomy previously. She has continued refractory iron deficiency with continued rectal bleeding from her hemorrhoids, and otherwise 2  negative colonoscopies. I referred her to Dr. Greer Pickerel at [facility] surgery for consideration of hemorrhoidectomy. She can use p.r.n. Analpram cream, but I doubt this will make a difference in her bleeding. She is not on anticoagulants. Celiac panel has been ordered to exclude associated iron malabsorption. Review of her extensive records shows that she was previously B12 deficient but is on parenteral replacement therapy. I see no need to repeat her colonoscopy or endoscopy at this time. Orders: T-Sprue Panel (Celiac Disease Aby Eval) ([id number]) T-igA [phone]) [facility] ([facility])  Problem # 2:  RECTAL BLEEDING (ICD-569.3) Assessment: Deteriorated  Orders: T-Sprue Panel (Celiac Disease Aby Eval) ([id number]) T-igA ([id number]) [facility] ([facility])  Patient Instructions: 1)  Copy sent to : Anda Kraft, MD & Greer Pickerel, MD 2)  Your prescription(s) have been sent to you pharmacy.  3)  Your appt with Dr. Greer Pickerel is scheduled for 08/13/2010, arrive at 1:30pm 4)  Please go to the basement today for your labs.  5)  The medication list was reviewed and reconciled.  All changed / newly prescribed medications were explained.  A complete medication list was provided to the patient / caregiver. 6)  Constipation and Hemorrhoids brochure given.  Prescriptions: HYDROCORTISONE ACE-PRAMOXINE 2.5-1 % CREA (HYDROCORTISONE ACE-PRAMOXINE) use as needed  #1 tube x 1   Entered by:   Bernita Buffy CMA ([place])   Authorized by:   Sable Feil MD [facility]   Signed by:   Bernita Buffy CMA ([place]) on 07/21/2010   Method used:   Electronically to  [address] [phone]* (retail)       [place], [place]  [address]       Ph: [phone]       Fax: [phone]   RxID:   [id number]   Appended Document: ANEMIA/YF copoy Dr.Murinson in oncology  Appended Document: ANEMIA/YF biscomed  Appended Document: ANEMIA/YF copy Dr.  Santiago Bur at the [facility]: ANEMIA/YF biscommed

## 2010-10-29 NOTE — Consult Note (Signed)
Summary: [facility]   Imported By: Rise Patience 09/09/2010 16:10:07  _____________________________________________________________________  External Attachment:    Type:   Image     Comment:   External Document

## 2010-11-04 NOTE — Letter (Signed)
Summary: [facility]   Imported By: Phillis Knack 10/30/2010 11:07:27  _____________________________________________________________________  External Attachment:    Type:   Image     Comment:   External Document

## 2010-11-04 NOTE — Letter (Signed)
Summary: Gayland Curry MD/[facility]   Imported By: Bubba Hales 10/28/2010 11:23:25  _____________________________________________________________________  External Attachment:    Type:   Image     Comment:   External Document

## 2010-11-05 ENCOUNTER — Other Ambulatory Visit (HOSPITAL_COMMUNITY): Payer: Self-pay | Admitting: Oncology

## 2010-11-05 ENCOUNTER — Encounter (HOSPITAL_BASED_OUTPATIENT_CLINIC_OR_DEPARTMENT_OTHER): Payer: Self-pay | Admitting: Oncology

## 2010-11-05 DIAGNOSIS — E538 Deficiency of other specified B group vitamins: Secondary | ICD-10-CM

## 2010-11-05 DIAGNOSIS — N289 Disorder of kidney and ureter, unspecified: Secondary | ICD-10-CM

## 2010-11-05 DIAGNOSIS — D509 Iron deficiency anemia, unspecified: Secondary | ICD-10-CM

## 2010-11-05 DIAGNOSIS — D649 Anemia, unspecified: Secondary | ICD-10-CM

## 2010-11-05 LAB — CBC WITH DIFFERENTIAL/PLATELET
BASO%: 0.4 % (ref 0.0–2.0)
EOS%: 3.5 % (ref 0.0–7.0)
HCT: 37.8 % (ref 34.8–46.6)
MONO#: 0.5 10*3/uL (ref 0.1–0.9)
Platelets: 273 10*3/uL (ref 145–400)
RDW: 14 % (ref 11.2–14.5)
WBC: 7.9 10*3/uL (ref 3.9–10.3)

## 2010-12-07 LAB — BASIC METABOLIC PANEL
Calcium: 9.4 mg/dL (ref 8.4–10.5)
Chloride: 108 mEq/L (ref 96–112)
Creatinine, Ser: 1.5 mg/dL — ABNORMAL HIGH (ref 0.4–1.2)
GFR calc Af Amer: 43 mL/min — ABNORMAL LOW (ref >60)
Glucose, Bld: 122 mg/dL — ABNORMAL HIGH (ref 70–99)
Potassium: 3.7 mEq/L (ref 3.5–5.1)
Sodium: 143 mEq/L (ref 135–145)

## 2010-12-07 LAB — GLUCOSE, CAPILLARY
Glucose-Capillary: 147 mg/dL — ABNORMAL HIGH (ref 70–99)
Glucose-Capillary: 151 mg/dL — ABNORMAL HIGH (ref 70–99)
Glucose-Capillary: 156 mg/dL — ABNORMAL HIGH (ref 70–99)

## 2010-12-07 LAB — CBC
MCH: 33.4 pg (ref 26.0–34.0)
MCV: 101.3 fL — ABNORMAL HIGH (ref 78.0–100.0)
Platelets: 284 10*3/uL (ref 150–400)
RBC: 3.71 MIL/uL — ABNORMAL LOW (ref 3.87–5.11)
RDW: 14.9 % (ref 11.5–15.5)

## 2010-12-07 LAB — DIFFERENTIAL
Basophils Relative: 0 % (ref 0–1)
Lymphs Abs: 1.8 10*3/uL (ref 0.7–4.0)
Neutrophils Relative %: 63 % (ref 43–77)

## 2010-12-08 ENCOUNTER — Other Ambulatory Visit (HOSPITAL_COMMUNITY): Payer: Self-pay | Admitting: Oncology

## 2010-12-08 ENCOUNTER — Encounter (HOSPITAL_BASED_OUTPATIENT_CLINIC_OR_DEPARTMENT_OTHER): Payer: BC Managed Care – PPO | Admitting: Oncology

## 2010-12-08 DIAGNOSIS — D649 Anemia, unspecified: Secondary | ICD-10-CM

## 2010-12-08 DIAGNOSIS — E538 Deficiency of other specified B group vitamins: Secondary | ICD-10-CM

## 2010-12-08 DIAGNOSIS — D509 Iron deficiency anemia, unspecified: Secondary | ICD-10-CM

## 2010-12-08 DIAGNOSIS — N289 Disorder of kidney and ureter, unspecified: Secondary | ICD-10-CM

## 2010-12-08 LAB — CBC WITH DIFFERENTIAL/PLATELET
Basophils Absolute: 0 10*3/uL (ref 0.0–0.1)
Eosinophils Absolute: 0.2 10*3/uL (ref 0.0–0.5)
HCT: 37.3 % (ref 34.8–46.6)
HGB: 12.7 g/dL (ref 11.6–15.9)
MCH: 33.5 pg (ref 25.1–34.0)
NEUT#: 5.8 10*3/uL (ref 1.5–6.5)
NEUT%: 69.3 % (ref 38.4–76.8)
RDW: 13.8 % (ref 11.2–14.5)
lymph#: 1.7 10*3/uL (ref 0.9–3.3)

## 2011-01-11 LAB — BASIC METABOLIC PANEL
BUN: 19 mg/dL (ref 6–23)
CO2: 23 mEq/L (ref 19–32)
Chloride: 112 mEq/L (ref 96–112)
GFR calc Af Amer: 41 mL/min — ABNORMAL LOW (ref >60)
GFR calc non Af Amer: 43 mL/min — ABNORMAL LOW (ref >60)
Glucose, Bld: 108 mg/dL — ABNORMAL HIGH (ref 70–99)
Potassium: 4.9 mEq/L (ref 3.5–5.1)
Potassium: 4 mEq/L (ref 3.5–5.1)
Sodium: 141 mEq/L (ref 135–145)
Sodium: 140 mEq/L (ref 135–145)

## 2011-01-11 LAB — DIFFERENTIAL
Basophils Absolute: 0 10*3/uL (ref 0.0–0.1)
Basophils Relative: 0 % (ref 0–1)
Basophils Relative: 1 % (ref 0–1)
Eosinophils Relative: 2 % (ref 0–5)
Monocytes Absolute: 1 10*3/uL (ref 0.1–1.0)
Monocytes Absolute: 1 10*3/uL (ref 0.1–1.0)
Monocytes Relative: 11 % (ref 3–12)
Neutro Abs: 5.2 10*3/uL (ref 1.7–7.7)
Neutro Abs: 6.3 10*3/uL (ref 1.7–7.7)
Neutrophils Relative %: 64 % (ref 43–77)

## 2011-01-11 LAB — CBC
HCT: 22.5 % — ABNORMAL LOW (ref 36.0–46.0)
HCT: 29.2 % — ABNORMAL LOW (ref 36.0–46.0)
HCT: 29.8 % — ABNORMAL LOW (ref 36.0–46.0)
HCT: 29.7 % — ABNORMAL LOW (ref 36.0–46.0)
Hemoglobin: 6.8 g/dL — CL (ref 12.0–15.0)
Hemoglobin: 9.3 g/dL — ABNORMAL LOW (ref 12.0–15.0)
Hemoglobin: 9.6 g/dL — ABNORMAL LOW (ref 12.0–15.0)
MCHC: 30.4 g/dL (ref 30.0–36.0)
MCHC: 32.2 g/dL (ref 30.0–36.0)
MCHC: 32.4 g/dL (ref 30.0–36.0)
MCV: 74 fL — ABNORMAL LOW (ref 78.0–100.0)
MCV: 78.3 fL (ref 78.0–100.0)
Platelets: 411 10*3/uL — ABNORMAL HIGH (ref 150–400)
Platelets: 385 10*3/uL (ref 150–400)
RBC: 3.04 MIL/uL — ABNORMAL LOW (ref 3.87–5.11)
RBC: 3.7 MIL/uL — ABNORMAL LOW (ref 3.87–5.11)
RDW: 18.8 % — ABNORMAL HIGH (ref 11.5–15.5)
RDW: 19.8 % — ABNORMAL HIGH (ref 11.5–15.5)
RDW: 20 % — ABNORMAL HIGH (ref 11.5–15.5)
WBC: 9.2 10*3/uL (ref 4.0–10.5)

## 2011-01-11 LAB — CROSSMATCH
Donor AG Type: NEGATIVE
Donor AG Type: NEGATIVE

## 2011-01-11 LAB — IRON AND TIBC
Saturation Ratios: 23 % (ref 20–55)
TIBC: 402 ug/dL (ref 250–470)

## 2011-01-11 LAB — VITAMIN B12: Vitamin B-12: 186 pg/mL — ABNORMAL LOW (ref 211–911)

## 2011-01-11 LAB — FERRITIN: Ferritin: >2 ng/mL (ref 10–291)

## 2011-01-11 LAB — GLUCOSE, CAPILLARY: Glucose-Capillary: 87 mg/dL (ref 70–99)

## 2011-01-11 LAB — PROTIME-INR: Prothrombin Time: 13.5 seconds (ref 11.6–15.2)

## 2011-02-08 ENCOUNTER — Other Ambulatory Visit (HOSPITAL_COMMUNITY): Payer: Self-pay | Admitting: Oncology

## 2011-02-08 ENCOUNTER — Encounter (HOSPITAL_BASED_OUTPATIENT_CLINIC_OR_DEPARTMENT_OTHER): Payer: BC Managed Care – PPO | Admitting: Oncology

## 2011-02-08 DIAGNOSIS — E538 Deficiency of other specified B group vitamins: Secondary | ICD-10-CM

## 2011-02-08 DIAGNOSIS — D649 Anemia, unspecified: Secondary | ICD-10-CM

## 2011-02-08 DIAGNOSIS — N289 Disorder of kidney and ureter, unspecified: Secondary | ICD-10-CM

## 2011-02-08 DIAGNOSIS — D509 Iron deficiency anemia, unspecified: Secondary | ICD-10-CM

## 2011-02-08 LAB — COMPREHENSIVE METABOLIC PANEL
CO2: 27 mEq/L (ref 19–32)
Creatinine, Ser: 1.71 mg/dL — ABNORMAL HIGH (ref 0.40–1.20)
Glucose, Bld: 135 mg/dL — ABNORMAL HIGH (ref 70–99)
Sodium: 143 mEq/L (ref 135–145)
Total Bilirubin: 0.3 mg/dL (ref 0.3–1.2)
Total Protein: 6.6 g/dL (ref 6.0–8.3)

## 2011-02-08 LAB — IRON AND TIBC
%SAT: 31 % (ref 20–55)
Iron: 91 ug/dL (ref 42–145)
TIBC: 293 ug/dL (ref 250–470)
UIBC: 202 ug/dL

## 2011-02-08 LAB — FERRITIN: Ferritin: 57 ng/mL (ref 10–291)

## 2011-02-08 LAB — CBC WITH DIFFERENTIAL/PLATELET
Basophils Absolute: 0 10*3/uL (ref 0.0–0.1)
Eosinophils Absolute: 0.2 10*3/uL (ref 0.0–0.5)
HCT: 37.9 % (ref 34.8–46.6)
HGB: 13.1 g/dL (ref 11.6–15.9)
MCH: 34.3 pg — ABNORMAL HIGH (ref 25.1–34.0)
MONO#: 0.7 10*3/uL (ref 0.1–0.9)
NEUT%: 73.5 % (ref 38.4–76.8)
WBC: 7.2 10*3/uL (ref 3.9–10.3)
lymph#: 1.1 10*3/uL (ref 0.9–3.3)

## 2011-02-09 NOTE — Op Note (Signed)
[name], [name]               ACCOUNT NO.:  [id number]   MEDICAL RECORD NO.:  [id number]          PATIENT TYPE:  AMB   LOCATION:  ENDO                         [facility]AC[facility]   PHYSICIAN:  Waverly Ferrari, M.D.    DATE OF BIRTH:  [birth year]   DATE OF PROCEDURE:  03/14/2007  DATE OF DISCHARGE:                               OPERATIVE REPORT   PROCEDURE:  Colonoscopy.   INDICATIONS:  Hemoccult positivity.   ANESTHESIA:  Fentanyl 25 mcg, Versed 7 mg.   PROCEDURE:  With the patient mildly sedated in the left lateral  decubitus position, the Pentax videoscopic colonoscope was inserted into  the rectum and passed under direct vision to the cecum, identified by  ileocecal valve and appendiceal orifice, both of which were  photographed.  From this point the colonoscope was slowly withdrawn  taking circumferential views of colonic mucosa, stopping only in the  rectum, which appeared normal on direct and retroflexed view.  The  endoscope was straightened and withdrawn.  The patient's vital signs and  pulse oximeter remained stable.  The patient tolerated the procedure  well without apparent complication.   FINDINGS:  Negative examination.   PLAN:  Have the patient follow up with me as an outpatient.           ______________________________  Waverly Ferrari, M.D.     GMO/MEDQ  D:  03/14/2007  T:  03/14/2007  Job:  [id number]

## 2011-02-09 NOTE — H&P (Signed)
[name], [name]               ACCOUNT NO.:  [id number]   MEDICAL RECORD NO.:  [id number]          PATIENT TYPE:  INP   LOCATION:  5118                         FACILITY:  [facility]   PHYSICIAN:  Joylene John, MD        DATE OF BIRTH:  1946-02-14   DATE OF ADMISSION:  10/09/2008  DATE OF DISCHARGE:                              HISTORY & PHYSICAL   PRIMARY CARE PHYSICIAN:  Ronaldo Miyamoto, M.D.   CHIEF COMPLAINT:  Weakness.   HISTORY OF THE PRESENT ILLNESS:  The patient is a 65 year old obese  female with a history of diabetes, anemia, arthritis, asthma, carpal  tunnel syndrome, obstructive sleep apnea, and hypothyroidism, who  presents complaining of weakness and shortness of breath.  The patient  reports that for the last two or three weeks she has been getting  progressively tired.  She has a chronic history of blood loss anemia;  however, workup done in the past has been largely unremarkable.  On her  last colonoscopy she was noted to have melanosis coli, which was  biopsied; however, the results of that are still pending.  The patient  reports that every time she has a bowel movement she has some blood  loss.  She has required four transfusions in the last 16-18 months.  Otherwise she denies any chest pain, palpitations or abdominal pain.  As  noted above she complains of bright red blood per rectum, otherwise no  hematemesis and no vaginal blood loss.  She is currently feeling okay.  She is being admitted for transfusion.   PAST MEDICAL HISTORY:  1. Diabetes.  2. Anemia; extensive workup done in the past including GI workup,      which was unremarkable.  3. Arthritis.  4. Asthma.  5. Carpal tunnel syndrome.  6. Obstructive sleep apnea.  7. Hypothyroidism.   PAST SURGICAL HISTORY:  1. Hysterectomy.  2. Thyroid surgery.  3. Appendectomy.  4. Tonsillectomy and adenoidectomy.   FAMILY HISTORY:  There is no family history of blood loss.   SOCIAL HISTORY:  The social history  is negative x3.   MEDICATIONS:  1. Benicar 40/25 once daily.  2. Detrol 4 mg by mouth daily.  3. Metformin 1,000 mg by mouth twice a day.  4. Vytorin 10/40 once daily.  5. Synthroid 100 mcg by mouth daily.  6. Januvia 100 mg by mouth daily.   PHYSICAL EXAMINATION:  VITAL SIGNS:  Blood pressure 109/61, temperature  98.7, pulse 90 and respiratory rate 20.  GENERAL APPEARANCE:  In general the patient is in no acute distress.  HEENT: Head, eyes, ears, nose, and throat benign.  CHEST:  The chest is clear to auscultation bilaterally.  HEART:  Cardiovascular - regular rate and rhythm.  No murmurs, rubs or  gallops.  ABDOMEN:  Soft, obese, nontender and nondistended.  Normal active bowel  sounds.  EXTREMITIES:  The extremities reveal no clubbing, cyanosis or edema.   LABORATORY DATA:  Sodium 141, potassium 4.9, BUN 27 and creatinine 1.6.  White count 8.8, H&H 6.8 and 22.5, and platelets 471,000.   ASSESSMENT AND  PLAN:  The  patient is a 65 year old obese female with a  history of diabetes, anemia, arthritis, asthma, and history of chronic  blood loss anemia now admitted for another episode of anemia.  Problem  #1  Chronic blood loss anemia.  This is similar to her past  presentation.  She already had an extensive workup done for evaluation  of this anemia.  I will transfuse three units of packed red blood cells  to keep the hemoglobin around 10 and hematocrit around 30, and will  follow her response.  The patient should also be on iron and vitamin C  supplementation.  Problem  #2  Hypertension.  We will continue outpatient medication.  Problem  #3  Diabetes.  We will continue metformin and Januvia, and  start the patient on sliding scale insulin.  Problem  #4  Hypothyroidism.  We will check the TSH and continue the  patient on her Synthroid.  Problem  #5  Obstructive sleep apnea.  Continue outpatient medication.  1. Prophylaxis.  Nexium.      Joylene John, MD     MJ/MEDQ  D:   10/09/2008  T:  10/10/2008  Job:  [id number]   cc:   Ronaldo Miyamoto, M.D.

## 2011-02-09 NOTE — Op Note (Signed)
[name], [name]               ACCOUNT NO.:  [id number]   MEDICAL RECORD NO.:  [id number]          PATIENT TYPE:  INP   LOCATION:  5118                         FACILITY:  [facility]   PHYSICIAN:  Waverly Ferrari, M.D.    DATE OF BIRTH:  08-Aug-1946   DATE OF PROCEDURE:  10/09/2008  DATE OF DISCHARGE:                               OPERATIVE REPORT   PROCEDURE:  Colonoscopy.   INDICATION:  Rectal bleeding with anemia.   ANESTHESIA:  Fentanyl 15 mcg, Versed 5 mg.   PROCEDURE:  With the patient mildly sedated in the left lateral  decubitus position, the rectal exam was performed.  It was unremarkable.  Subsequently, a Pentax videoscopic colonoscope was inserted in the  rectum and passed under direct vision with pressure applied.  The  patient rolled to her back, we were able to reach the cecum identified  by base of cecum and ileocecal valve, both of which were photographed.  From this point, the colonoscope was slowly withdrawn, taken  circumferential views of colonic mucosa and stopping to photograph.  White patches seen scattered throughout the colon in the mucosa and  biopsied these areas in multiple places as well.  The endoscope was  withdrawn and all the way to the rectum which appeared normal in direct  and retroflex view.  Pullthrough the anal canal, however, showed that  she had significant external hemorrhoids that were bleeding.  Photographs taken.  The patient's vital signs and pulse oximetry  remained stable.  The patient tolerated the procedure well without  apparent complications.   FINDINGS:  White patches in colonic mucosa, etiology which is unclear.  Biopsies taken and had significant external hemorrhoids that were  bleeding and quite possibly could be the cause of the patient's blood  loss.   PLAN:  We will await biopsy report.  If the patient is stable, may be  discharged with outpatient followup, will probably need an external  hemorrhoidectomy.     ______________________________  Waverly Ferrari, M.D.     GMO/MEDQ  D:  10/11/2008  T:  10/12/2008  Job:  [id number]

## 2011-02-09 NOTE — Op Note (Signed)
[name], [name]               ACCOUNT NO.:  [id number]   MEDICAL RECORD NO.:  [id number]          PATIENT TYPE:  INP   LOCATION:  5118                         FACILITY:  [facility]   PHYSICIAN:  Waverly Ferrari, M.D.    DATE OF BIRTH:  02/20/1946   DATE OF PROCEDURE:  DATE OF DISCHARGE:                               OPERATIVE REPORT   PROCEDURE:  Upper endoscopy.   INDICATIONS:  GI bleeding.   ANESTHESIA:  Fentanyl, 100 mcg.  Versed 10 mg.   DESCRIPTION OF PROCEDURE:  With the patient mildly sedated in the left  lateral decubitus position, the Pentax videoscopic endoscope was  inserted in the mouth and passed under direct vision through the  esophagus, which appeared normal in the stomach, fundus body, antrum,  duodenal bulb, and second portion of duodenum were visualized.  From  this point, the endoscope was slowly withdrawn, taking circumferential  views of duodenal mucosa until the endoscope had been pulled back into  the stomach, placed in retroflexion to view the stomach from below, and  then the cardia of the stomach near the GE junction, is a linear area of  gastritis which was seen, photographed, and biopsied.  There might have  been some flecks of blood in the stomach, accounted for possible some of  the patient's blood loss, but not acutely.  The endoscope was then  straightened and withdrawn, taking circumferentially with the remaining  gastric and esophageal mucosa.  The patient's vital signs and pulse  oximetry remained stable.  The patient tolerated the procedure well  without apparent complications.   FINDINGS:  Local area of gastris in the fundus of the stomach, biopsied.  Await biopsy report.  The patient will call me for results and followup  with me as an outpatient.   PROCEDURE:  Colonoscopy as planned.           ______________________________  Waverly Ferrari, M.D.     GMO/MEDQ  D:  10/11/2008  T:  10/12/2008  Job:  [id number]

## 2011-02-09 NOTE — Discharge Summary (Signed)
[name], [name]               ACCOUNT NO.:  [id number]   MEDICAL RECORD NO.:  [id number]          PATIENT TYPE:  INP   LOCATION:  [id number]                         FACILITY:  [facility]   PHYSICIAN:  Edythe Lynn, M.D.       DATE OF BIRTH:  March 26, 1946   DATE OF ADMISSION:  08/02/2007  DATE OF DISCHARGE:  08/04/2007                               DISCHARGE SUMMARY   PRIMARY CARE PHYSICIAN:  Dr. Wilson Singer.   DISCHARGE DIAGNOSES:  1. Severe anemia - unclear etiology - diagnostic workup in progress.  2. Diabetes mellitus type 2.  3. Hypertension.  4. Obesity.  5. Hyperlipidemia.  6. Obstructive sleep apnea.  7. Depression.  8. Hypothyroidism.   DISCHARGE MEDICATIONS:  1. Metformin 500 mg twice a day.  2. Januvia 100 mg daily.  3. Vytorin 10/80 daily.  4. Synthroid 100 mcg a day.  5. Benicar HCT 40/25 mg daily.  6. Detrol LA 4 mg daily.  7. Calcium vitamin D twice a day.  8. Advair as needed.  9. Fish oil 1000 mg 3 times a day.   CONDITION ON DISCHARGE:  The patient was discharged in good condition.  She was told to follow up with her primary care physician, Dr. Wilson Singer.  The patient will also follow up with her primary gastroenterologist, Dr.  Lajoyce Corners, for the results of the capsule endoscopy.  The patient's discharge  hemoglobin is 9.6.   PROCEDURES DURING THIS ADMISSION:  The patient underwent on August 04, 2007, capsule endoscopy.   CONSULTATION:  No consultations obtained.   HISTORY AND PHYSICAL:  Refer to dictated H&P done by Dr. Arletha Pili,  August 02, 2007.   HOSPITAL COURSE:  1. Severe anemia.  [name] was admitted on the evening of August 02, 2007, after she was found to have a hemoglobin level of 5.8.  As      the patient was weak, short of breath, and quite symptomatic from      her anemia, she underwent transfusion of 3 units of packed red      blood cells.  The patient tolerated procedure well without      complications.  On August 03, 2007, the patient's  primary      gastroenterologist, Dr. Lajoyce Corners, was consulted, and he recommended a      capsule endoscopy.  The procedure is ongoing at the time of my      dictation, and the results will be available once the patient is      discharged home..  2. In regards to the patient's diabetes, hypertension, hyperlipidemia,      depression and hypothyroidism, these problems have been stable      throughout this admission, and we did not operate any changes in      the patient's home medication.      Edythe Lynn, M.D.  Electronically Signed     SL/MEDQ  D:  08/04/2007  T:  08/05/2007  Job:  [id number]   cc:   Ronaldo Miyamoto, M.D.  Waverly Ferrari, M.D.

## 2011-02-09 NOTE — H&P (Signed)
NAMEALIEGHA, HOCKENBERRY               ACCOUNT NO.:  [id number]   MEDICAL RECORD NO.:  [id number]          PATIENT TYPE:  OBV   LOCATION:  5504                         FACILITY:  [facility]   PHYSICIAN:  Sharlet Salina, M.D.   DATE OF BIRTH:  Oct 06, 1945   DATE OF ADMISSION:  08/02/2007  DATE OF DISCHARGE:                              HISTORY & PHYSICAL   HISTORY OF PRESENT ILLNESS:  The patient is a 65 year old white female  that presented to the hospital for transfusion.  She saw Dr. Wilson Singer, her  primary care physician, had a hemoglobin of 5.8.  She was sent to short-  stay for a transfusion.  Per patient, she was told she had antibodies in  her blood and could not get the transfusion done prior to short-stay  closing.  She stated that this anemia has been going on for quite some  time.  She has hemorrhoids and sometimes she will have some bleeding  from the hemorrhoids but does not think it is a large amount of blood.  She had upper and lower endoscopy done in June of this year with Dr. Lajoyce Corners  and they were both good.  She also had a colonoscopy last year,  February, and that was also normal.  She is due to have a capsule  endoscopy with Dr. Lajoyce Corners.  She has no abdominal pain.  She has no bright  red blood per rectum at present.  She was taking iron previously but has  discontinued taking iron; she took iron back in June/July.   PAST MEDICAL HISTORY:  Significant for:  1. Microcytic anemia.  2. Hemorrhoids with rectal bleeding.  EGD and colonoscopy in June 2008      normal.  3. Obesity.  4. Diabetes.  5. Hypertension.  6. Osteoarthritis.  7. Dyslipidemia.  8. Obstructive sleep apnea.  9. Bronchial asthma.  10.Abdominal hernia.  11.Depression.  12.Hypothyroidism.  13.Carpal tunnel syndrome.  14.Status post hysterectomy.  15.Status tonsillectomy and adenoidectomy.  16.Status post hemorrhoid surgery September 1993.  17.Status post appendectomy.  18.Status post thyroid nodule removed in  1972.  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   FAMILY HISTORY:  Mother is 76 years with hypertension.  Father died of  unknown etiology.  No siblings.   SOCIAL HISTORY:  The patient is divorced.  She has three children.  No  tobacco or alcohol use.  She drives a school bus.   MEDICATIONS:  1. She takes metformin 500 two tablets twice daily.  2. Januvia 100 mg daily.  3. Vytorin 10/80 daily.  4. Synthroid 100 mcg daily.  5. Benicar HCTZ 40/25 daily.  6. Detrol LA 4 mg daily.  7. Calcium with vitamin D twice daily.  8. Diphenhydramine 2 per day p.r.n.  9. Stool softener with stimulant laxative Docusate sodium plus      sennosides, 8-10 per day p.r.n.  10.Meloxicam 15 mg one per day as needed for arthritis.  11.Albuterol as needed.  12.Flonase nasal spray as needed.  13.Advair 50/100 as needed.  14.Betamethasone 0.05% cream as needed for skin rash.  15.Fish oil 1000 mg 3  per day.   ALLERGIES:  None.   REVIEW OF SYSTEMS:  As per stated in HPI.   PHYSICAL EXAM:  Vital signs to be done.  The patient is a direct admit.  VITAL SIGNS:  Temperature 100.4, heart rate of 97, respirations are 20,  blood pressure 111/63, O2 sat 98% on room air.  HEENT:  Normocephalic, atraumatic.  Pupils equal, round, reactive to  light.  Throat without erythema.  CARDIOVASCULAR:  Regular rate/ rhythm.  LUNGS:  Clear to auscultation bilaterally.  ABDOMEN:  Soft, nontender, nondistended.  Positive bowel sounds.  She  has an abdominal wall hernia.  EXTREMITIES:  Without edema.   LABS:  Brought over with patient:  Hemoglobin 5.8, MCV of 72.9,  platelets 509,000.   ASSESSMENT/PLAN:  1. Microcytic anemia:  The patient has been worked up for this      outpatient by her primary care physician.  She was sent to the      hospital for transfusion but, because of antibodies found in her      blood, it will take a while to find a match so she was admitted to      observation to have blood transfusion.   Will transfuse the patient      3 units overnight.  She will be discharged in the morning.  Patient      wants to know if she could get the capsule endoscopy per Dr. Lajoyce Corners      prior to being discharged.  Will call Dr. Lajoyce Corners tomorrow to see if      this can be done inpatient.  2. Diabetes:  Will continue her on her diabetes medications.  3. Dyslipidemia:  Will continue her on her dyslipidemia medications.      Sharlet Salina, M.D.  Electronically Signed     NJ/MEDQ  D:  08/02/2007  T:  08/03/2007  Job:  [id number]   cc:   Ronaldo Miyamoto, M.D.

## 2011-02-09 NOTE — Discharge Summary (Signed)
[name], [name]               ACCOUNT NO.:  [id number]   MEDICAL RECORD NO.:  [id number]          PATIENT TYPE:  INP   LOCATION:  5118                         FACILITY:  [facility]   PHYSICIAN:  Cherene Altes, M.D.DATE OF BIRTH:  [birth year]   DATE OF ADMISSION:  10/09/2008  DATE OF DISCHARGE:  10/12/2008                               DISCHARGE SUMMARY   PRIMARY CARE PHYSICIAN:  Ronaldo Miyamoto, M.D.   GI PHYSICIAN:  Waverly Ferrari, M.D.   DISCHARGE DIAGNOSES:  1. Acute/chronic blood loss anemia.      a.     Hemoglobin 6.8 at presentation.      b.     Status post transfusion of 3 units of packed red blood       cells.      c.     Hemoglobin 9.4 at discharge.  2. Gastrointestinal bleeding.      a.     Status post esophagogastroduodenoscopy and colonoscopy.      b.     External hemorrhoids which were actively bleeding noted on       colonoscopy.      c.     Gastric and colonic biopsies pending at discharge.  3. Severe B12 deficiency.      a.     Newly-diagnosed.      b.     B12 186 during hospital stay.      c.     Subcutaneous B12 administered during hospital stay.      d.     Outpatient trial of oral B12 with recommendation for recheck       of B12 level in 7-10 days.  4. Diabetes mellitus.  5. Osteoarthritis.  6. Carpal tunnel syndrome.  7. obstructive sleep apnea.  8. Hypothyroidism.  9. Status post hysterectomy.  10.Status post appendectomy.  11.Status post tonsillectomy and adenoidectomy.   DISCHARGE MEDICATIONS:  1. Benicar 40/25 p.o. daily.  2. Detrol 4 mg p.o. daily.  3. Metformin 1000 mg p.o. b.i.d.  4. Vytorin 10/40 p.o. daily.  5. Synthroid 100 mcg p.o. daily  6. Januvia 100 mg p.o. daily  7. Ranitidine 150 mg p.o. daily or Protonix 40 mg p.o. daily or      omeprazole 20 mg p.o. daily.  8. MiraLax or Benaprotein fiber supplement.  9. Iron sulfate 325 mg p.o. b.i.d. with food.  10.Vitamin B12 1000 mcg tablets 2 tablets daily until further notice.   FOLLOWUP:  1. The patient is instructed to call Dr. Shan Levans office for biopsy      results and for further followup recommendations.  2. The patient is advised to follow up with Dr. Wilson Singer in 7-10 days.      At that time a B12 level should be assessed to determine if oral      replacement therapy will suffice for the patient's B12 deficiency.      It is quite possible that she suffers with a malabsorption problem      and will require frequent injections.   CONSULTATION:  Dr. Jim Desanctis with gastroenterology.   PROCEDURE:  1.  EGD October 10, 2008:  Gastric biopsies but no acute abnormalities      appreciated.  2. Colonoscopy October 10, 2008:  Multiple white patches throughout      the colon with external bleeding hemorrhoids.  White patches were      biopsied with results pending.   HOSPITAL COURSE:  [name] is a very pleasant 65 year old  female who presented with a history of severe weakness.  Workup in the  emergency room included a CBC which revealed a hemoglobin of 6.8.  The  patient was found to be guaiac-positive.  The patient was admitted to  the acute units.  She was transfused a total of 3 units of packed red  blood cells.  Gastroenterology consultation was carried out.  The  patient underwent a colonoscopy and EGD with the results as noted above.  As part of the patient's evaluation, a routine screening B12 was also  performed.  This returned remarkably low as noted above.  Subcu B12  injections at 1000 mcg per day were initiated for total of 2 doses  during the patient's hospital stay.  At the time of discharge it is felt  reasonable to attempt an outpatient oral treatment regimen for B12  deficiency as described above.  It is recognized the patient may be  suffering with a malabsorption issue and if the patient's B12 is not  sufficiently elevated within 10-14 days max, it is likely that recurrent  outpatient B12 injections will be required.  In regard to the  patient's  anemia, her blood count responded nicely to her transfusion.  Her  hemoglobin reached a peak of approximately 9.4 and remained stable  through the remainder of her hospitalization.  Colonoscopy did reveal  evidence of external hemorrhoids and this is felt to likely be the  primary cause of the patient's blood loss anemia.  A number of white  patches were noted on the colonoscopy and these were sent for  pathology.  The results are pending and the patient is instructed to  call Dr. Lajoyce Corners for a final diagnosis.  Otherwise, biopsies were also made  of the gastric mucosa and these too are pending at the present time.  Nonetheless, on October 12, 2008, the patient was tolerating a regular  diet without difficulty.  She was ambulating around her room reported  that her severe weakness was markedly improved.  She was therefore  cleared for discharge home with the above-listed followup  recommendations.      Cherene Altes, M.D.  Electronically Signed     JTM/MEDQ  D:  10/12/2008  T:  10/12/2008  Job:  [id number]   cc:   Ronaldo Miyamoto, M.D.  Waverly Ferrari, M.D.

## 2011-02-09 NOTE — Op Note (Signed)
[name], [name]               ACCOUNT NO.:  [id number]   MEDICAL RECORD NO.:  [id number]          PATIENT TYPE:  AMB   LOCATION:  ENDO                         FACILITY:  [facility]   PHYSICIAN:  Waverly Ferrari, M.D.    DATE OF BIRTH:  1946-01-29   DATE OF PROCEDURE:  DATE OF DISCHARGE:                               OPERATIVE REPORT   PROCEDURE:  Colonoscopy.   INDICATIONS:  Rectal bleeding.   ANESTHESIA:  Fentanyl 50 mcg, Versed 3 mg.   DESCRIPTION OF PROCEDURE:  With the patient mildly sedated in the left  lateral decubitus position, the Pentax videoscopic colonoscope was  inserted into the rectum and passed under direct vision to the cecum.  Identified by base of cecum and ileocecal valve, both of which were  photographed.  Prep was slightly suboptimal in that there was thickish  yellow liquid material remaining in the colon that at one time clogged  the suction tubing.  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  Then stopping next in the transverse colon where a polyp was  seen, photographed, removed using hot biopsy forceps technique setting  of 20/150 blended current.  All the tissue was eradicated using hot  biopsy forceps as best we could.  The endoscope was then withdrawn all  the way to the rectum which appeared normal on direct and retroflexed  views.  The endoscope was straightened and withdrawn.  The patient's  vital signs, pulse oximeter remained stable.  The patient tolerated  procedure well without apparent complications.   FINDINGS:  Question of melanosis coli biopsied and polyp of transverse  colon biopsied.  Await biopsy report.  The patient will call for results  and follow up with [place] as an outpatient.           ______________________________  Waverly Ferrari, M.D.     GMO/MEDQ  D:  03/19/2008  T:   03/19/2008  Job:  [id number]

## 2011-02-09 NOTE — Op Note (Signed)
[name], [name]               ACCOUNT NO.:  [id number]   MEDICAL RECORD NO.:  [id number]          PATIENT TYPE:  AMB   LOCATION:  ENDO                         FACILITY:  [facility]   PHYSICIAN:  Waverly Ferrari, M.D.    DATE OF BIRTH:  Jan 30, 1946   DATE OF PROCEDURE:  03/19/2008  DATE OF DISCHARGE:                               OPERATIVE REPORT   PROCEDURE:  Upper endoscopy.   INDICATIONS:  GI bleeding.   ANESTHESIA:  Fentanyl 75 mcg, Versed 5 mg.   PROCEDURE:  With the patient mildly sedated in the left lateral  decubitus position, the Pentax videoscopic endoscope was inserted in the  mouth passed under direct vision through the esophagus, which appeared  normal, into the stomach.  We saw blood flecks in the stomach,  photographed and advanced.  Fundus and body appeared normal.  Antrum,  however, showed some diffuse patchy erythema that was  photographed and  multiple biopsies taken.  Duodenal bulb, second portion duodenum  appeared normal.  From this point the endoscope was slowly withdrawn  taking circumferential views of duodenal mucosa until the endoscope been  pulled back into stomach, placed in retroflexion to view the stomach  from below.  The endoscope was then straightened and withdrawn after  biopsying the antrum.  The patient's vital signs, pulse oximeter  remained stable.  The patient tolerated procedure well without apparent  complications.   FINDINGS:  Erythema of the antrum biopsied with some blood flecks in the  stomach indicating that this may be the site of blood loss.   PLAN:  Await biopsy report.  The patient will call me for results and  follow up with me as an outpatient.  Proceed to colonoscopy.           ______________________________  Waverly Ferrari, M.D.     GMO/MEDQ  D:  03/19/2008  T:  03/19/2008  Job:  [id number]

## 2011-02-09 NOTE — Op Note (Signed)
[name], [name]               ACCOUNT NO.:  [id number]   MEDICAL RECORD NO.:  [id number]          PATIENT TYPE:  AMB   LOCATION:  ENDO                         [facility]ACILITY:  [facility]   PHYSICIAN:  Waverly Ferrari, M.D.    DATE OF BIRTH:  03/05/1946   DATE OF PROCEDURE:  03/14/2007  DATE OF DISCHARGE:                               OPERATIVE REPORT   PROCEDURE:  Upper endoscopy.   INDICATIONS:  Hemoccult positivity.   ANESTHESIA:  Fentanyl 50 mcg, Versed 4 mg.   PROCEDURE:  With the patient mildly sedated in the left lateral  decubitus position, the Pentax videoscopic endoscope was inserted in the  mouth and passed into the esophagus, which appeared normal, into the  stomach.  The fundus, body, antrum, duodenal bulb, second portion of the  duodenum were visualized.  From this point the endoscope was slowly  withdrawn taking circumferential views of the duodenal mucosa until the  endoscope had been pulled back into the stomach, placed in retroflexion  to view the stomach from below.  The endoscope was straightened and  withdrawn taking circumferential views of the remaining gastric and  esophageal mucosa.  The patient's vital signs and pulse oximeter  remained stable.  The patient tolerated the procedure well without  apparent complications.   FINDINGS:  Negative examination.   PLAN:  Proceed to colonoscopy.           ______________________________  Waverly Ferrari, M.D.     GMO/MEDQ  D:  03/14/2007  T:  03/14/2007  Job:  [id number]

## 2011-02-25 IMAGING — CR DG CHEST 2V
2 series · 2 of 2 positions shown · non-contrast
Comparison: None.

CLINICAL DATA: Preoperative evaluation.

CHEST - 2 VIEW

[w chest pa]
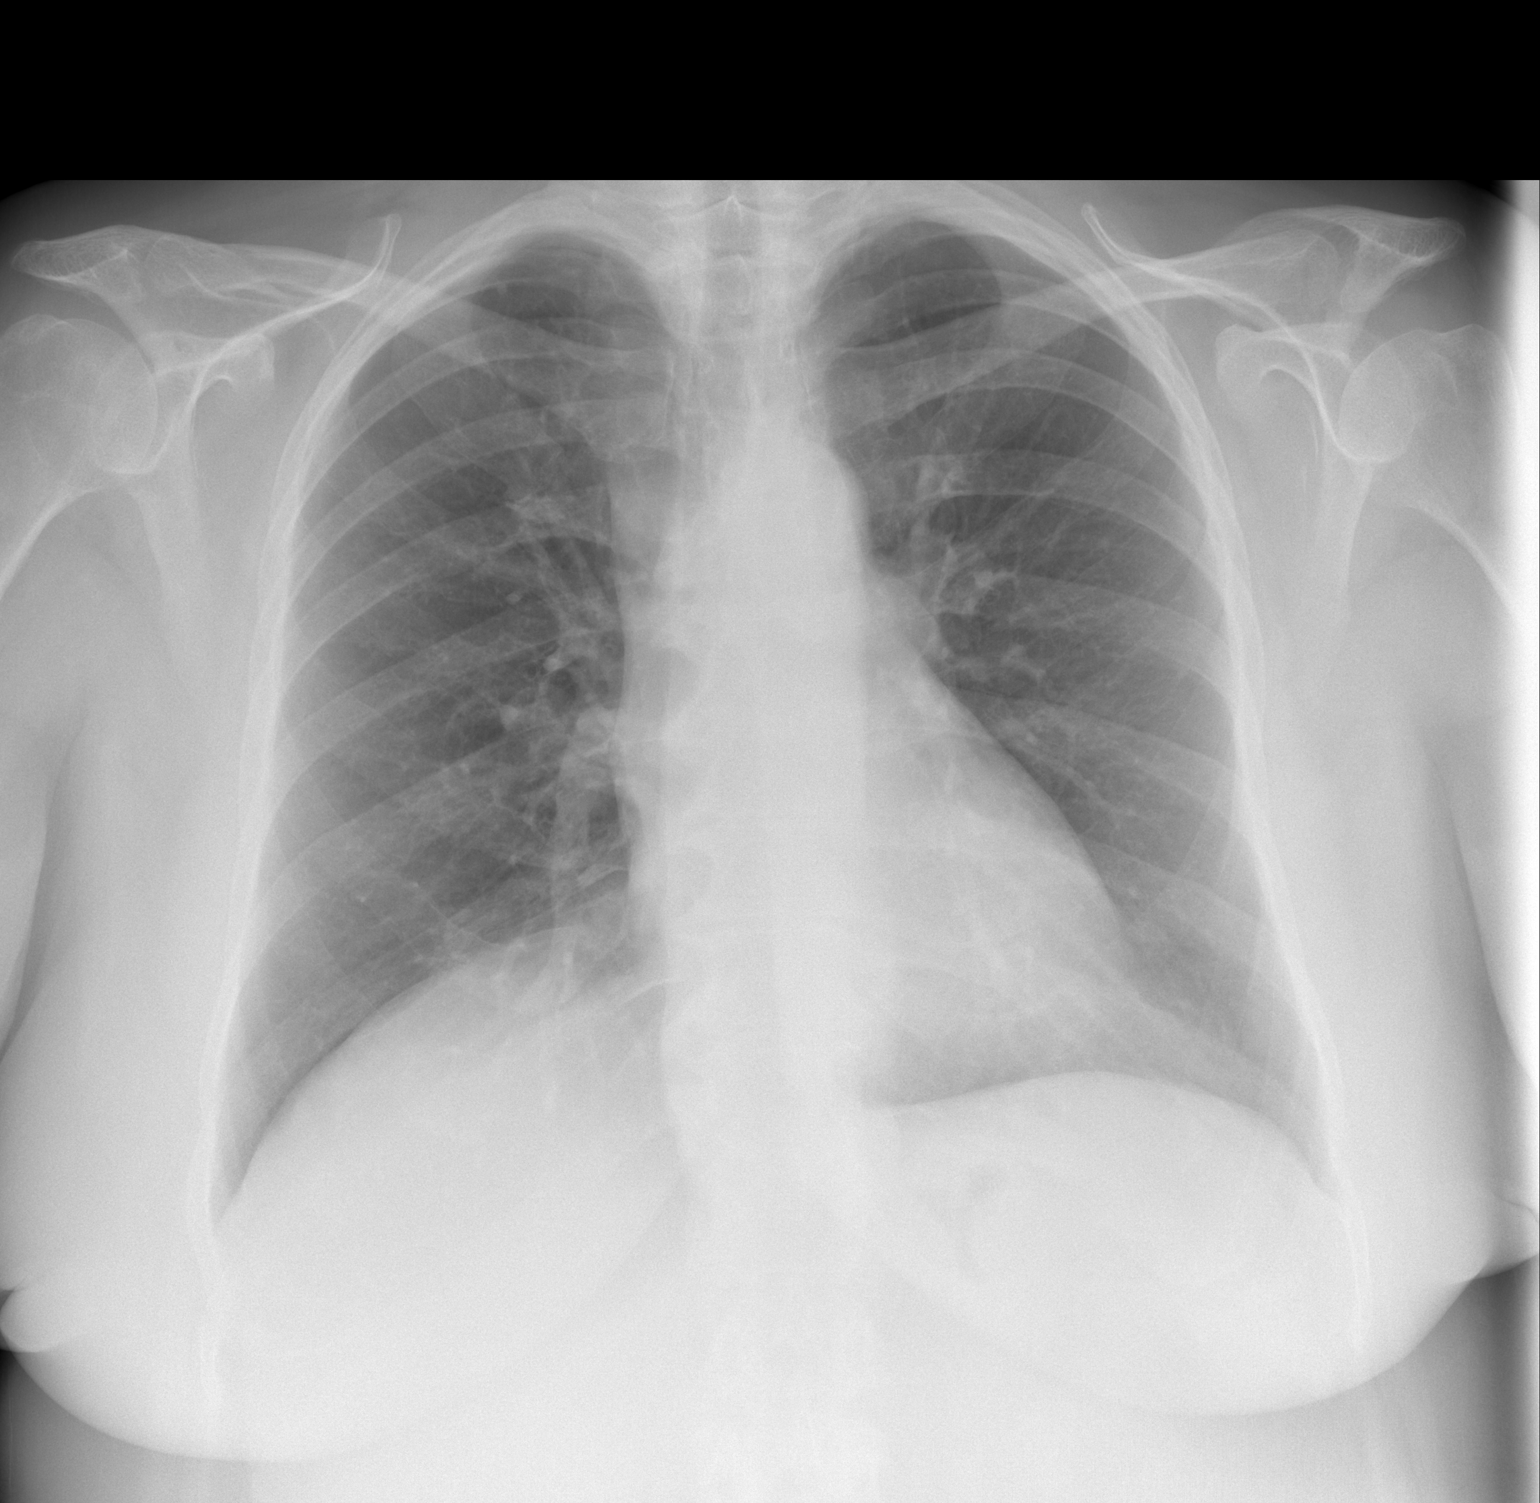

[w chest lat]
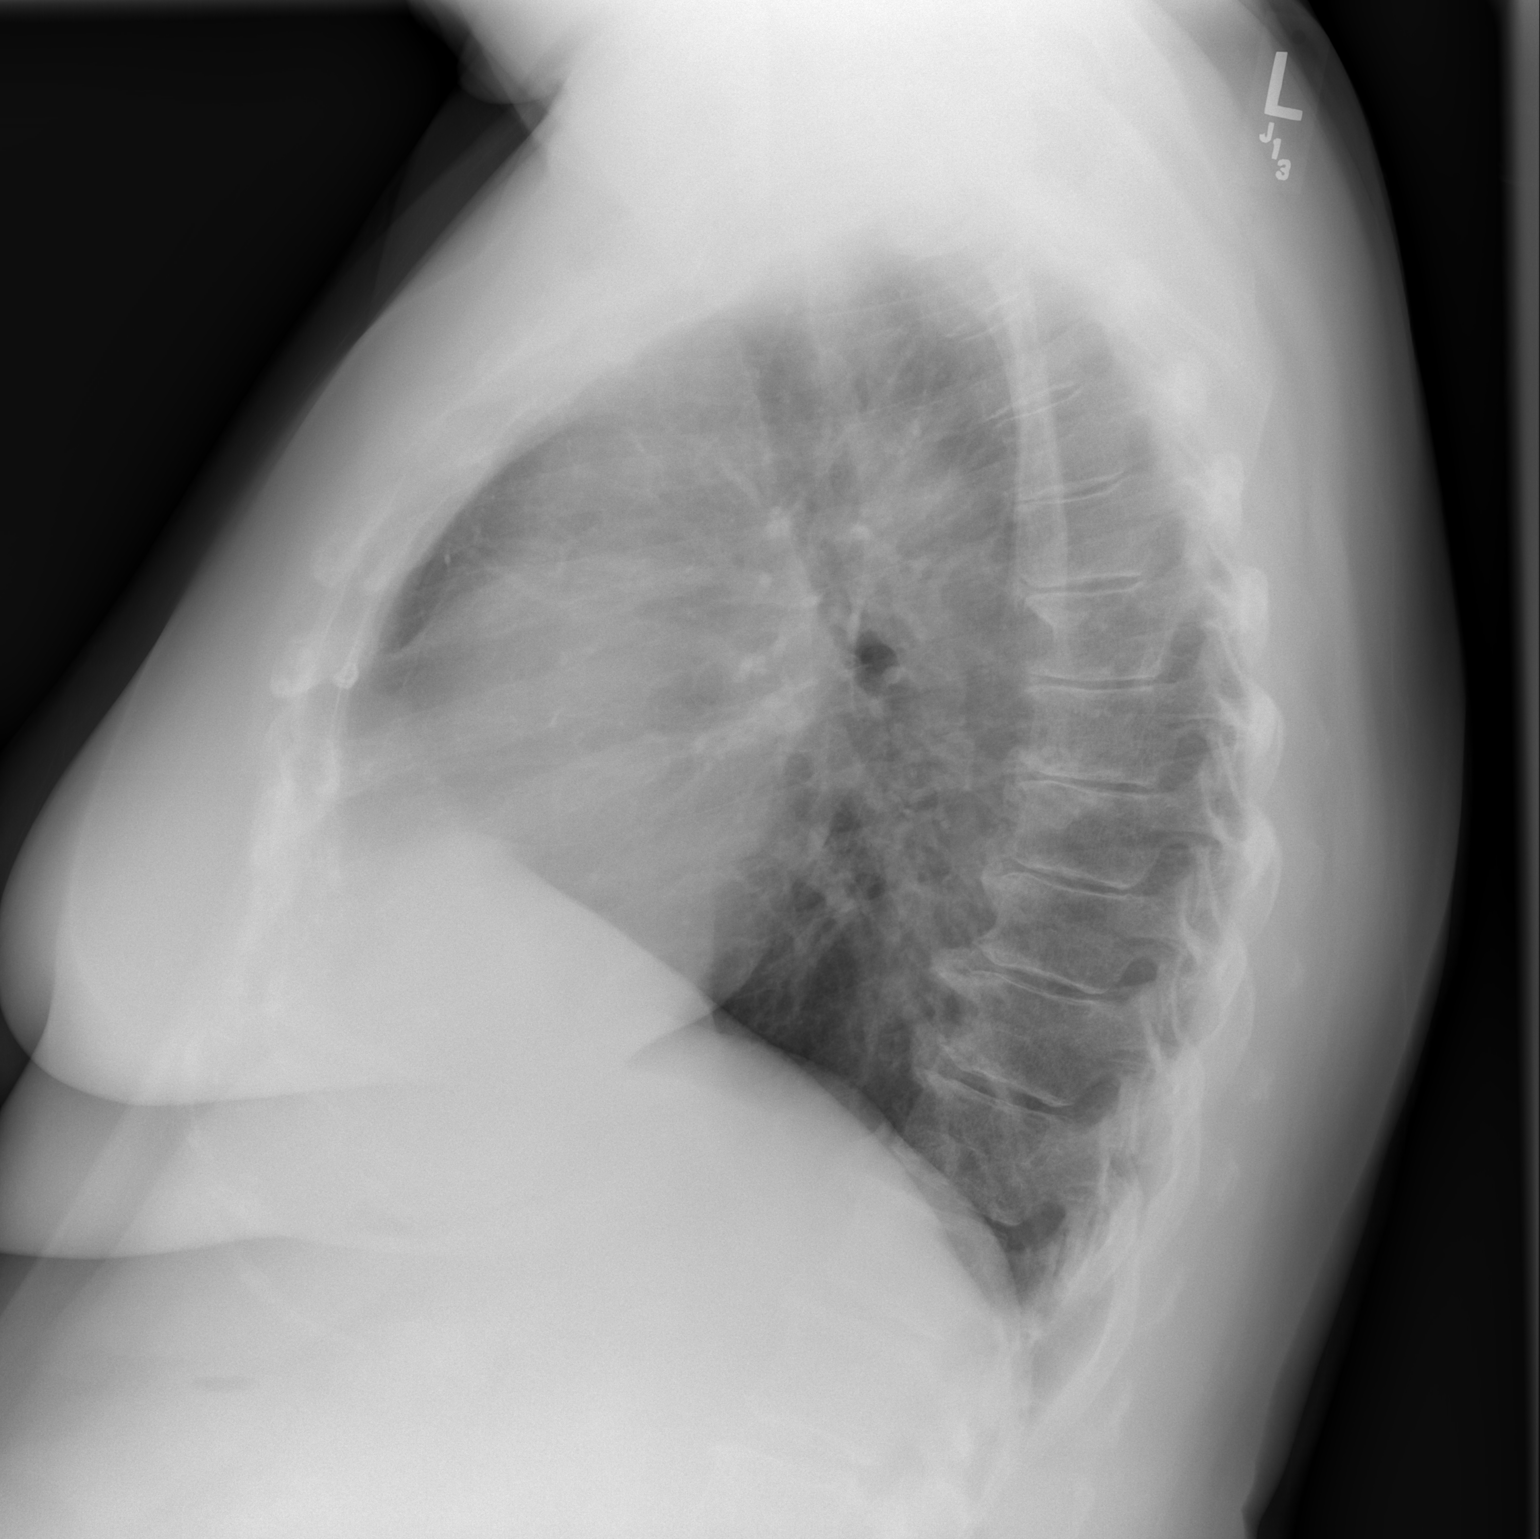

[2 of 2 positions shown; findings below may reference images not displayed]

FINDINGS: The lungs are mildly hyperexpanded but clear
bilaterally.  No confluent airspace opacities, pleural effuions or
pneumothoracies are seen.  The heart is normal in size and contour.
The upper abdomen and osseous structures are normal. Multilevel
degenerative changes are seen in the thoracic spine.
IMPRESSION: No acute cardiopulmonary disease.

## 2011-04-08 ENCOUNTER — Encounter (HOSPITAL_BASED_OUTPATIENT_CLINIC_OR_DEPARTMENT_OTHER): Payer: BC Managed Care – PPO | Admitting: Oncology

## 2011-04-08 ENCOUNTER — Other Ambulatory Visit (HOSPITAL_COMMUNITY): Payer: Self-pay | Admitting: Oncology

## 2011-04-08 DIAGNOSIS — E538 Deficiency of other specified B group vitamins: Secondary | ICD-10-CM

## 2011-04-08 DIAGNOSIS — D649 Anemia, unspecified: Secondary | ICD-10-CM

## 2011-04-08 DIAGNOSIS — D509 Iron deficiency anemia, unspecified: Secondary | ICD-10-CM

## 2011-04-08 DIAGNOSIS — N289 Disorder of kidney and ureter, unspecified: Secondary | ICD-10-CM

## 2011-04-08 LAB — CBC WITH DIFFERENTIAL/PLATELET
Basophils Absolute: 0 10*3/uL (ref 0.0–0.1)
EOS%: 3.8 % (ref 0.0–7.0)
Eosinophils Absolute: 0.3 10*3/uL (ref 0.0–0.5)
HGB: 13.6 g/dL (ref 11.6–15.9)
MCV: 96.6 fL (ref 79.5–101.0)
MONO%: 7.8 % (ref 0.0–14.0)
NEUT#: 4.8 10*3/uL (ref 1.5–6.5)
RBC: 4.1 10*6/uL (ref 3.70–5.45)
RDW: 12.4 % (ref 11.2–14.5)
WBC: 7.2 10*3/uL (ref 3.9–10.3)
lymph#: 1.6 10*3/uL (ref 0.9–3.3)
nRBC: 0 % (ref 0–0)

## 2011-04-08 LAB — FERRITIN: Ferritin: 50 ng/mL (ref 10–291)

## 2011-06-17 ENCOUNTER — Other Ambulatory Visit (HOSPITAL_COMMUNITY): Payer: Self-pay | Admitting: Oncology

## 2011-06-17 ENCOUNTER — Encounter (HOSPITAL_BASED_OUTPATIENT_CLINIC_OR_DEPARTMENT_OTHER): Payer: BC Managed Care – PPO | Admitting: Oncology

## 2011-06-17 DIAGNOSIS — E538 Deficiency of other specified B group vitamins: Secondary | ICD-10-CM

## 2011-06-17 DIAGNOSIS — N289 Disorder of kidney and ureter, unspecified: Secondary | ICD-10-CM

## 2011-06-17 DIAGNOSIS — D649 Anemia, unspecified: Secondary | ICD-10-CM

## 2011-06-17 DIAGNOSIS — D509 Iron deficiency anemia, unspecified: Secondary | ICD-10-CM

## 2011-06-17 LAB — CBC WITH DIFFERENTIAL/PLATELET
BASO%: 0.3 % (ref 0.0–2.0)
EOS%: 3.4 % (ref 0.0–7.0)
HCT: 34.2 % — ABNORMAL LOW (ref 34.8–46.6)
HGB: 11.9 g/dL (ref 11.6–15.9)
MCH: 34.2 pg — ABNORMAL HIGH (ref 25.1–34.0)
MCHC: 34.7 g/dL (ref 31.5–36.0)
MONO#: 0.6 10*3/uL (ref 0.1–0.9)
RDW: 12.9 % (ref 11.2–14.5)
WBC: 7.4 10*3/uL (ref 3.9–10.3)
lymph#: 1.5 10*3/uL (ref 0.9–3.3)

## 2011-06-17 LAB — COMPREHENSIVE METABOLIC PANEL
AST: 16 U/L (ref 0–37)
Alkaline Phosphatase: 73 U/L (ref 39–117)
BUN: 37 mg/dL — ABNORMAL HIGH (ref 6–23)
Glucose, Bld: 140 mg/dL — ABNORMAL HIGH (ref 70–99)
Sodium: 142 mEq/L (ref 135–145)
Total Bilirubin: 0.1 mg/dL — ABNORMAL LOW (ref 0.3–1.2)
Total Protein: 6.4 g/dL (ref 6.0–8.3)

## 2011-06-23 LAB — CROSSMATCH
ABO/RH(D): A POS
Antibody Screen: POSITIVE
DAT, IgG: NEGATIVE

## 2011-06-23 LAB — IRON AND TIBC
Saturation Ratios: 6 — ABNORMAL LOW
UIBC: 341

## 2011-06-23 LAB — RETICULOCYTES
RBC.: 3.37 — ABNORMAL LOW
Retic Count, Absolute: 40.4
Retic Ct Pct: 1.2

## 2011-07-06 LAB — DIFFERENTIAL
Basophils Absolute: 0
Eosinophils Absolute: 0.2
Eosinophils Relative: 2
Lymphocytes Relative: 22
Monocytes Absolute: 0.9 — ABNORMAL HIGH

## 2011-07-06 LAB — TYPE AND SCREEN
ABO/RH(D): A POS
Donor AG Type: NEGATIVE
Donor AG Type: NEGATIVE

## 2011-07-06 LAB — CBC
HCT: 29.2 — ABNORMAL LOW
HCT: 29.4 — ABNORMAL LOW
Hemoglobin: 9.4 — ABNORMAL LOW
MCHC: 32.3
MCV: 79.7
MCV: 79.8
Platelets: 488 — ABNORMAL HIGH
Platelets: 512 — ABNORMAL HIGH
RDW: 18.4 — ABNORMAL HIGH
RDW: 18.4 — ABNORMAL HIGH
WBC: 9.1

## 2011-07-06 LAB — BASIC METABOLIC PANEL
BUN: 22
CO2: 30
Chloride: 98
Creatinine, Ser: 1.57 — ABNORMAL HIGH
GFR calc non Af Amer: 34 — ABNORMAL LOW
GFR calc non Af Amer: 36 — ABNORMAL LOW
Glucose, Bld: 124 — ABNORMAL HIGH
Potassium: 4.2
Potassium: 4.5
Sodium: 140

## 2011-09-09 ENCOUNTER — Other Ambulatory Visit (HOSPITAL_BASED_OUTPATIENT_CLINIC_OR_DEPARTMENT_OTHER): Payer: BC Managed Care – PPO | Admitting: Lab

## 2011-09-09 ENCOUNTER — Other Ambulatory Visit (HOSPITAL_COMMUNITY): Payer: Self-pay | Admitting: Oncology

## 2011-09-09 DIAGNOSIS — E538 Deficiency of other specified B group vitamins: Secondary | ICD-10-CM

## 2011-09-09 DIAGNOSIS — D509 Iron deficiency anemia, unspecified: Secondary | ICD-10-CM

## 2011-09-09 DIAGNOSIS — D649 Anemia, unspecified: Secondary | ICD-10-CM

## 2011-09-09 DIAGNOSIS — N289 Disorder of kidney and ureter, unspecified: Secondary | ICD-10-CM

## 2011-09-09 LAB — CBC WITH DIFFERENTIAL/PLATELET
Basophils Absolute: 0 10*3/uL (ref 0.0–0.1)
HCT: 36.2 % (ref 34.8–46.6)
HGB: 12.6 g/dL (ref 11.6–15.9)
MONO#: 0.6 10*3/uL (ref 0.1–0.9)
NEUT%: 63.1 % (ref 38.4–76.8)
WBC: 6.6 10*3/uL (ref 3.9–10.3)
lymph#: 1.6 10*3/uL (ref 0.9–3.3)

## 2011-09-17 ENCOUNTER — Other Ambulatory Visit: Payer: Self-pay | Admitting: Endocrinology

## 2011-09-17 DIAGNOSIS — N289 Disorder of kidney and ureter, unspecified: Secondary | ICD-10-CM

## 2011-09-22 ENCOUNTER — Ambulatory Visit
Admission: RE | Admit: 2011-09-22 | Discharge: 2011-09-22 | Disposition: A | Discharge status: Home / Self Care | Payer: BC Managed Care – PPO | Source: Ambulatory Visit | Attending: Endocrinology | Admitting: Endocrinology

## 2011-09-22 DIAGNOSIS — N289 Disorder of kidney and ureter, unspecified: Secondary | ICD-10-CM

## 2011-11-22 ENCOUNTER — Telehealth: Payer: Self-pay | Admitting: Oncology

## 2011-11-22 NOTE — Telephone Encounter (Signed)
S/w the pt and she is aware of her march 2013 appts

## 2011-12-14 ENCOUNTER — Telehealth: Payer: Self-pay | Admitting: Oncology

## 2011-12-14 NOTE — Telephone Encounter (Signed)
per rb move appt from 3/29 to 3/22 and the pt is aware  aom

## 2011-12-17 ENCOUNTER — Other Ambulatory Visit (HOSPITAL_BASED_OUTPATIENT_CLINIC_OR_DEPARTMENT_OTHER): Payer: BC Managed Care – PPO | Admitting: Lab

## 2011-12-17 ENCOUNTER — Ambulatory Visit (HOSPITAL_BASED_OUTPATIENT_CLINIC_OR_DEPARTMENT_OTHER): Payer: BC Managed Care – PPO | Admitting: Oncology

## 2011-12-17 ENCOUNTER — Encounter: Payer: Self-pay | Admitting: Oncology

## 2011-12-17 VITALS — BP 116/67 | HR 77 | Temp 97.9°F | Ht 69.0 in | Wt 278.0 lb

## 2011-12-17 DIAGNOSIS — N289 Disorder of kidney and ureter, unspecified: Secondary | ICD-10-CM

## 2011-12-17 DIAGNOSIS — M810 Age-related osteoporosis without current pathological fracture: Secondary | ICD-10-CM

## 2011-12-17 DIAGNOSIS — M549 Dorsalgia, unspecified: Secondary | ICD-10-CM

## 2011-12-17 DIAGNOSIS — D509 Iron deficiency anemia, unspecified: Secondary | ICD-10-CM

## 2011-12-17 DIAGNOSIS — E538 Deficiency of other specified B group vitamins: Secondary | ICD-10-CM

## 2011-12-17 LAB — CBC WITH DIFFERENTIAL/PLATELET
Basophils Absolute: 0.1 10*3/uL (ref 0.0–0.1)
Eosinophils Absolute: 0.2 10*3/uL (ref 0.0–0.5)
HGB: 12.8 g/dL (ref 11.6–15.9)
LYMPH%: 23.2 % (ref 14.0–49.7)
MCV: 99.5 fL (ref 79.5–101.0)
MONO%: 7 % (ref 0.0–14.0)
NEUT#: 5.5 10*3/uL (ref 1.5–6.5)
Platelets: 252 10*3/uL (ref 145–400)
RDW: 12.5 % (ref 11.2–14.5)

## 2011-12-17 LAB — COMPREHENSIVE METABOLIC PANEL
Albumin: 3.9 g/dL (ref 3.5–5.2)
Alkaline Phosphatase: 78 U/L (ref 39–117)
BUN: 31 mg/dL — ABNORMAL HIGH (ref 6–23)
Glucose, Bld: 111 mg/dL — ABNORMAL HIGH (ref 70–99)
Potassium: 4.2 mEq/L (ref 3.5–5.3)

## 2011-12-17 LAB — IRON AND TIBC
%SAT: 18 % — ABNORMAL LOW (ref 20–55)
Iron: 57 ug/dL (ref 42–145)

## 2011-12-17 LAB — FERRITIN: Ferritin: 38 ng/mL (ref 10–291)

## 2011-12-17 LAB — LACTATE DEHYDROGENASE: LDH: 193 U/L (ref 94–250)

## 2011-12-17 NOTE — Progress Notes (Signed)
CC:   [name], M.D. [name]. Redmond Pulling, MD, FACS [name]. [name], MD, [name], [name]  PROBLEM LIST: 1. Iron deficiency anemia associated with pagophagia felt to be most     likely due to severe bleeding from external hemorrhoids.     The patient underwent hemorrhoidectomy and banding on 09/17/2010. 2. History of GI bleeding in January of 2010. 3. History of vitamin B12 deficiency. 4. Renal insufficiency. 5. Obstructive sleep apnea. 6. Diabetes mellitus. 7. Hypertension. 8. Hypothyroidism. 9. Obesity. 10.Osteoarthritis involving the low back, knees and shoulders with     chronic pain. 11.History of depression in 1996 through 1999.  MEDICATIONS: 1. Calcium carbonate-vitamin D 600-400 one tablet twice daily. 2. Benadryl 25 mg every 6 hours as needed. 3. Colace 100 mg twice daily. 4. Vytorin 10-40 one tablet at bedtime. 5. Ferrous sulfate 325 mg twice daily.  The patient had been on this     up until a couple months ago but stopped taking her oral iron.  I     urged her to restart this and continue indefinitely. 6. Levothyroxine 100 mg daily. 7. Hyzaar 50-12.5 mg 1 tablet twice daily. 8. Metformin 500 mg 4 times daily. 9. Zantac 150 mg at bedtime. 10.Januvia 100 mg daily. 11.Detrol LA 4 mg daily for urinary frequency.  HISTORY:  [name] was seen for followup of her iron deficiency anemia due to hemorrhoidal bleeding.  The patient was last seen by [place] on 06/17/2011.  She also has renal insufficiency.  She had received IV Feraheme 510 mg in September, October and November of 2011 with good response.  The patient had undergone endoscopy and colonoscopy in January 2010.  [name] tells me that she did see Dr. Corliss Parish for evaluation of her renal insufficiency.  The patient also had seen Dr. Verl Blalock several months ago.  In his note he had stated that the patient had undergone a couple of colonoscopies and endoscopies by Dr. Jim Desanctis.  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  [name]'s main complaint is osteoarthritis and low back pain.  She was told to avoid nonsteroidal anti-inflammatory drugs because of her renal dysfunction.  I suggested to her that she may want to try Ultram and I suggested she discuss this with Dr. Wilson Singer when she sees him again in a few weeks.  PHYSICAL EXAMINATION:  [name] looks well although somewhat obese with a weight of 278 pounds, height 5 feet 9 inches, body surface area 2.48 meters squared.  Blood pressure 116/67.  Other vital signs are normal. There is no scleral icterus.  Mouth and pharynx are benign.  No peripheral adenopathy palpable.  Heart and lungs are normal.  Abdomen is obese, nontender with no organomegaly, masses palpable.  Extremities puffy without pitting edema.  Neurologic exam is grossly normal.  LABORATORY DATA:  White count 8.3, ANC 5.5, hemoglobin 12.8, hematocrit 38.1, platelets 252,000.  Chemistries were notable for a BUN of 31, creatinine 1.86.  Renal function seems to be stable.  Albumin 3.9, liver function tests were normal.  Ferritin on 12/13 was 46.  On 09/28 ferritin was 40 and on 04/08/2011 ferritin was 50.  Vitamin B12 level was greater than 2000 on 06/02/2010.  IMAGING STUDIES: 1. CT scan of abdomen and pelvis without IV contrast on 10/10/2008     showed no significant findings.  There was a small ventral hernia,     and a benign appearing renal cyst on the right.  No cause  of     bleeding was identified. 2. Chest x-ray, 2 view, from 09/14/2010 was negative. 3. Ultrasound of the kidneys from 09/22/2011 showed no hydronephrosis.     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  IMPRESSION AND PLAN:  [name] seems to be doing well at the present time. She denies any bleeding or any other signs of blood loss.  She stopped taking her iron a couple months ago because it ran out.  I have encouraged her  to continue with her iron which she can buy over-the- counter and take 1 twice a day.  In the absence of a rising ferritin with the patient on oral iron I am suspicious that she may have some ongoing occult GI bleeding or possibly poor absorption of the iron which I think is less likely.  We will continue to follow [name].  I will check a vitamin B12 level today.  She is not on any vitamin B12 at this time.  We will check CBC and ferritin in 3 months and plan to see [name] again in 6 months at which time we will check CBC, chemistries, iron studies and vitamin B12 level.    ______________________________ Jeanie Cooks, M.D. DSM/MEDQ  D:  12/17/2011  T:  12/17/2011  Job:  [id number]

## 2011-12-17 NOTE — Progress Notes (Signed)
This office note has been dictated.  [id number]

## 2011-12-20 ENCOUNTER — Telehealth: Payer: Self-pay | Admitting: Oncology

## 2011-12-20 ENCOUNTER — Other Ambulatory Visit: Payer: Self-pay | Admitting: Medical Oncology

## 2011-12-20 NOTE — Telephone Encounter (Signed)
pt called with appts for june and sept    aom

## 2011-12-23 ENCOUNTER — Ambulatory Visit: Payer: BC Managed Care – PPO | Admitting: Physician Assistant

## 2011-12-23 ENCOUNTER — Other Ambulatory Visit: Payer: BC Managed Care – PPO

## 2011-12-24 ENCOUNTER — Other Ambulatory Visit: Payer: BC Managed Care – PPO | Admitting: Lab

## 2011-12-24 ENCOUNTER — Ambulatory Visit: Payer: BC Managed Care – PPO | Admitting: Oncology

## 2012-01-13 ENCOUNTER — Telehealth: Payer: Self-pay

## 2012-01-13 NOTE — Telephone Encounter (Signed)
Labs received from [facility] and forwarded to [place]

## 2012-03-17 ENCOUNTER — Other Ambulatory Visit (HOSPITAL_BASED_OUTPATIENT_CLINIC_OR_DEPARTMENT_OTHER): Payer: BC Managed Care – PPO | Admitting: Lab

## 2012-03-17 DIAGNOSIS — D509 Iron deficiency anemia, unspecified: Secondary | ICD-10-CM

## 2012-03-17 LAB — CBC WITH DIFFERENTIAL/PLATELET
Basophils Absolute: 0 10*3/uL (ref 0.0–0.1)
Eosinophils Absolute: 0.5 10*3/uL (ref 0.0–0.5)
HCT: 36.7 % (ref 34.8–46.6)
HGB: 12.3 g/dL (ref 11.6–15.9)
MONO#: 0.7 10*3/uL (ref 0.1–0.9)
NEUT#: 5.5 10*3/uL (ref 1.5–6.5)
NEUT%: 65.1 % (ref 38.4–76.8)
RDW: 12.7 % (ref 11.2–14.5)
lymph#: 1.7 10*3/uL (ref 0.9–3.3)

## 2012-06-23 ENCOUNTER — Other Ambulatory Visit (HOSPITAL_BASED_OUTPATIENT_CLINIC_OR_DEPARTMENT_OTHER): Payer: BC Managed Care – PPO | Admitting: Lab

## 2012-06-23 ENCOUNTER — Encounter: Payer: Self-pay | Admitting: Oncology

## 2012-06-23 ENCOUNTER — Other Ambulatory Visit: Payer: Self-pay | Admitting: Oncology

## 2012-06-23 ENCOUNTER — Ambulatory Visit (HOSPITAL_BASED_OUTPATIENT_CLINIC_OR_DEPARTMENT_OTHER): Payer: BC Managed Care – PPO | Admitting: Oncology

## 2012-06-23 VITALS — BP 118/72 | HR 76 | Temp 98.3°F | Resp 20 | Ht 69.0 in | Wt 262.2 lb

## 2012-06-23 DIAGNOSIS — M1990 Unspecified osteoarthritis, unspecified site: Secondary | ICD-10-CM

## 2012-06-23 DIAGNOSIS — D509 Iron deficiency anemia, unspecified: Secondary | ICD-10-CM

## 2012-06-23 DIAGNOSIS — E538 Deficiency of other specified B group vitamins: Secondary | ICD-10-CM

## 2012-06-23 LAB — CBC WITH DIFFERENTIAL/PLATELET
Basophils Absolute: 0 10*3/uL (ref 0.0–0.1)
Eosinophils Absolute: 0.2 10*3/uL (ref 0.0–0.5)
HCT: 37.2 % (ref 34.8–46.6)
HGB: 13.1 g/dL (ref 11.6–15.9)
LYMPH%: 25.3 % (ref 14.0–49.7)
MCV: 97.5 fL (ref 79.5–101.0)
MONO#: 0.7 10*3/uL (ref 0.1–0.9)
MONO%: 9.5 % (ref 0.0–14.0)
NEUT#: 4.3 10*3/uL (ref 1.5–6.5)
NEUT%: 62.1 % (ref 38.4–76.8)
Platelets: 238 10*3/uL (ref 145–400)
RBC: 3.82 10*6/uL (ref 3.70–5.45)
WBC: 7 10*3/uL (ref 3.9–10.3)

## 2012-06-23 LAB — COMPREHENSIVE METABOLIC PANEL (CC13)
Albumin: 3.8 g/dL (ref 3.5–5.0)
CO2: 26 mEq/L (ref 22–29)
Glucose: 100 mg/dl — ABNORMAL HIGH (ref 70–99)
Potassium: 4.5 mEq/L (ref 3.5–5.1)
Sodium: 142 mEq/L (ref 136–145)
Total Protein: 6.4 g/dL (ref 6.4–8.3)

## 2012-06-23 LAB — IRON AND TIBC
%SAT: 24 % (ref 20–55)
Iron: 64 ug/dL (ref 42–145)

## 2012-06-23 LAB — FERRITIN: Ferritin: 42 ng/mL (ref 10–291)

## 2012-06-23 LAB — VITAMIN B12: Vitamin B-12: 255 pg/mL (ref 211–911)

## 2012-06-23 LAB — LACTATE DEHYDROGENASE (CC13): LDH: 154 U/L (ref 125–220)

## 2012-06-23 NOTE — Progress Notes (Signed)
This office note has been dictated.  [id number]

## 2012-06-23 NOTE — Progress Notes (Signed)
CC:   Ronaldo Miyamoto, M.D. Leighton Ruff. Redmond Pulling, MD, FACS [name]. [name], MD, [name], [place]   PROBLEM LIST:  1. Iron deficiency anemia associated with pagophagia felt to be most  likely due to severe bleeding from external hemorrhoids.  The patient underwent hemorrhoidectomy and banding on 09/17/2010.  2. History of GI bleeding in January of 2010.  3. History of vitamin B12 deficiency. On 10/10/2008 vitamin B12 level     was 186.   4. Renal insufficiency.  5. Obstructive sleep apnea.  6. Diabetes mellitus.  7. Hypertension.  8. Hypothyroidism.  9. Obesity.  10.Osteoarthritis involving the low back, knees and shoulders with  chronic pain.  11.History of depression in 1996 through 1999. 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  MEDICATIONS: 1. Colchicine 0.6 mg daily. 2. Benadryl 25 mg every 6 hours as needed. 3. Colace 100 mg twice daily. 4. Vytorin 10-41 one at bedtime. 5. Febuxostat (Uloric) 40 mg daily. 6. Ferrous sulfate 325 mg twice daily. 7. Lasix 40 mg as needed. 8. Levothyroxine 100 mcg daily. 9. Cozaar 50 mg twice daily. 10.Metformin 500 mg 4 times daily. 11.Zantac 150 mg at bedtime. 12.Januvia 100 mg daily. 13.Detrol LA 4 mg daily. 14.Ultram 50 mg every 6 hours as needed.   SMOKING HISTORY:  The patient has never smoked cigarettes.   HISTORY:  I am seeing [name] for followup of her iron deficiency anemia due to hemorrhoidal bleeding.  It will be recalled that the patient underwent hemorrhoidectomy and banding on 09/17/2010.  She denies any bleeding at this time.  She was last seen by [place] on 12/17/2011.  She denies any pagophagia.  It will be recalled that the patient had received Feraheme 510 mg in September, October and November of 2011 with good response.  She had undergone endoscopy and colonoscopy in January 2010.  At the present time she is doing fairly well with no major complaints other than generalized  osteoarthritis, particularly in her low back.  As stated, she denies pagophagia.  PHYSICAL EXAMINATION:  There is little change.  She looks well although somewhat overweight.  Weight is 262.2 pounds, height 5 feet 9 inches, body surface area 2.41 m2.  Blood pressure 118/72.  Other vital signs are normal.  There is no scleral icterus.  Mouth and pharynx are benign. No peripheral adenopathy palpable.  Lungs were clear to percussion and auscultation.  Cardiac exam, regular rhythm with soft systolic murmur. Breasts are not examined.  The patient has not had mammograms in at least a couple of years.  She was strongly urged to obtain mammograms. Abdomen is obese, nontender with no organomegaly or masses palpable. Extremities are puffy with 1+ pitting edema.  Neurologic exam no focal findings.  LABORATORY DATA:  Today, white count 7.0, ANC 4.3, hemoglobin 13.1, hematocrit 37.2, platelets 238,000.  Red cell indices were normal. Chemistries today notable for BUN of 33 and a creatinine of 1.6, otherwise normal.  Iron studies as well as vitamin B12 level are pending.  Ferritin on 03/17/2012 was 148 and 38 on 12/17/2011.  IMAGING STUDIES:  1. CT scan of abdomen and pelvis without IV contrast on 10/10/2008  showed no significant findings. There was a small ventral hernia,  and a benign appearing renal cyst on the right. No cause of  bleeding was identified.  2. Chest x-ray, 2 view, from 09/14/2010 was negative.  3. Ultrasound of the kidneys from 09/22/2011 showed no hydronephrosis.  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   IMPRESSION AND PLAN:  [name] appears to be doing fairly well from the standpoint of her previous iron deficiency anemia.  We await the results of her iron studies.  If it appears that her ferritin is continuing to increase we may want to cut back on the ferrous sulfate perhaps to once daily.  We plan to check CBC and  ferritin in 3 months.  Will see [name] again in 6 months at which time we will check CBC, chemistries and iron studies.   Addendum: Ferritin was 42 with benign saturation of 24%. Vitamin B12 level was 255. We will plan to check stools for blood. Patient will be given 1 dose of Feraheme 510 mg IV. She will be instructed to start taking vitamin B12 1000 mcg every other day. ______________________________ Jeanie Cooks, M.D. DSM/MEDQ  D:  06/23/2012  T:  06/23/2012  Job:  [id number]

## 2012-06-26 ENCOUNTER — Telehealth: Payer: Self-pay

## 2012-06-26 ENCOUNTER — Telehealth: Payer: Self-pay | Admitting: Oncology

## 2012-06-26 NOTE — Telephone Encounter (Signed)
called pt with iton appt for 10/7 and 3/and 33mo appts for labs and visit     aom

## 2012-06-26 NOTE — Telephone Encounter (Signed)
S/w pt that DSM wants her to do stool cards. They are being mailed. Also vit B12 1027mcg QOD, she wrote this down. She has feraheme appt on 10/7. Pt expressed understanding.

## 2012-07-03 ENCOUNTER — Ambulatory Visit (HOSPITAL_BASED_OUTPATIENT_CLINIC_OR_DEPARTMENT_OTHER): Payer: BC Managed Care – PPO

## 2012-07-03 VITALS — BP 100/66 | HR 84 | Temp 98.1°F

## 2012-07-03 DIAGNOSIS — D509 Iron deficiency anemia, unspecified: Secondary | ICD-10-CM

## 2012-07-03 MED ORDER — SODIUM CHLORIDE 0.9 % IV SOLN
Freq: Once | INTRAVENOUS | Status: AC
  Administered 2012-07-03: 15:00:00 via INTRAVENOUS

## 2012-07-03 MED ORDER — FERUMOXYTOL INJECTION 510 MG/17 ML
510.0000 mg | Freq: Once | INTRAVENOUS | Status: AC
  Administered 2012-07-03: 510 mg via INTRAVENOUS
  Filled 2012-07-03: qty 17

## 2012-07-03 NOTE — Patient Instructions (Addendum)
Ferumoxytol injection What is this medicine? FERUMOXYTOL is an iron complex. Iron is used to make healthy red blood cells, which carry oxygen and nutrients throughout the body. This medicine is used to treat iron deficiency anemia in people with chronic kidney disease. This medicine may be used for other purposes; ask your health care provider or pharmacist if you have questions. What should I tell my health care provider before I take this medicine? They need to know if you have any of these conditions: -anemia not caused by low iron levels -high levels of iron in the blood -magnetic resonance imaging (MRI) test scheduled -an unusual or allergic reaction to iron, other medicines, foods, dyes, or preservatives -pregnant or trying to get pregnant -breast-feeding How should I use this medicine? This medicine is for infusion into a vein. It is given by a health care professional in a hospital or clinic setting. Talk to your pediatrician regarding the use of this medicine in children. Special care may be needed. Overdosage: If you think you've taken too much of this medicine contact a poison control center or emergency room at once. Overdosage: If you think you have taken too much of this medicine contact a poison control center or emergency room at once. NOTE: This medicine is only for you. Do not share this medicine with others. What if I miss a dose? It is important not to miss your dose. Call your doctor or health care professional if you are unable to keep an appointment. What may interact with this medicine? This medicine may interact with the following medications: -other iron products This list may not describe all possible interactions. Give your health care provider a list of all the medicines, herbs, non-prescription drugs, or dietary supplements you use. Also tell them if you smoke, drink alcohol, or use illegal drugs. Some items may interact with your medicine. What should I watch  for while using this medicine? Visit your doctor or healthcare professional regularly. Tell your doctor or healthcare professional if your symptoms do not start to get better or if they get worse. You may need blood work done while you are taking this medicine. You may need to follow a special diet. Talk to your doctor. Foods that contain iron include: whole grains/cereals, dried fruits, beans, or peas, leafy green vegetables, and organ meats (liver, kidney). What side effects may I notice from receiving this medicine? Side effects that you should report to your doctor or health care professional as soon as possible: -allergic reactions like skin rash, itching or hives, swelling of the face, lips, or tongue -breathing problems -changes in blood pressure -feeling faint or lightheaded, falls -fever or chills -flushing, sweating, or hot feelings -swelling of the ankles or feet Side effects that usually do not require medical attention (Report these to your doctor or health care professional if they continue or are bothersome.): -diarrhea -headache -nausea, vomiting -stomach pain This list may not describe all possible side effects. Call your doctor for medical advice about side effects. You may report side effects to FDA at 1-800-FDA-1088. Where should I keep my medicine? This drug is given in a hospital or clinic and will not be stored at home. NOTE: This sheet is a summary. It may not cover all possible information. If you have questions about this medicine, talk to your doctor, pharmacist, or health care provider.  2012, Elsevier/Gold Standard. (06/05/2008 9:48:25 PM) 

## 2012-09-25 ENCOUNTER — Other Ambulatory Visit: Payer: BC Managed Care – PPO | Admitting: Lab

## 2012-09-25 ENCOUNTER — Other Ambulatory Visit (HOSPITAL_BASED_OUTPATIENT_CLINIC_OR_DEPARTMENT_OTHER): Payer: BC Managed Care – PPO | Admitting: Lab

## 2012-09-25 DIAGNOSIS — D509 Iron deficiency anemia, unspecified: Secondary | ICD-10-CM

## 2012-09-25 DIAGNOSIS — E538 Deficiency of other specified B group vitamins: Secondary | ICD-10-CM

## 2012-09-25 LAB — CBC WITH DIFFERENTIAL/PLATELET
Basophils Absolute: 0 10*3/uL (ref 0.0–0.1)
Eosinophils Absolute: 0.2 10*3/uL (ref 0.0–0.5)
HGB: 13.7 g/dL (ref 11.6–15.9)
NEUT#: 8.1 10*3/uL — ABNORMAL HIGH (ref 1.5–6.5)
RDW: 12.7 % (ref 11.2–14.5)
lymph#: 1.7 10*3/uL (ref 0.9–3.3)

## 2012-09-25 LAB — VITAMIN B12: Vitamin B-12: 944 pg/mL — ABNORMAL HIGH (ref 211–911)

## 2012-12-22 ENCOUNTER — Encounter: Payer: Self-pay | Admitting: Oncology

## 2012-12-22 ENCOUNTER — Ambulatory Visit (HOSPITAL_BASED_OUTPATIENT_CLINIC_OR_DEPARTMENT_OTHER): Payer: BC Managed Care – PPO | Admitting: Oncology

## 2012-12-22 ENCOUNTER — Other Ambulatory Visit (HOSPITAL_BASED_OUTPATIENT_CLINIC_OR_DEPARTMENT_OTHER): Payer: BC Managed Care – PPO | Admitting: Lab

## 2012-12-22 VITALS — BP 127/83 | HR 73 | Temp 97.4°F | Resp 20 | Ht 69.0 in | Wt 268.6 lb

## 2012-12-22 DIAGNOSIS — E538 Deficiency of other specified B group vitamins: Secondary | ICD-10-CM

## 2012-12-22 DIAGNOSIS — D509 Iron deficiency anemia, unspecified: Secondary | ICD-10-CM

## 2012-12-22 DIAGNOSIS — N289 Disorder of kidney and ureter, unspecified: Secondary | ICD-10-CM

## 2012-12-22 LAB — CBC WITH DIFFERENTIAL/PLATELET
BASO%: 0.3 % (ref 0.0–2.0)
Basophils Absolute: 0 10*3/uL (ref 0.0–0.1)
HCT: 36.8 % (ref 34.8–46.6)
HGB: 12.5 g/dL (ref 11.6–15.9)
MONO#: 0.8 10*3/uL (ref 0.1–0.9)
NEUT#: 4.6 10*3/uL (ref 1.5–6.5)
NEUT%: 59.6 % (ref 38.4–76.8)
WBC: 7.8 10*3/uL (ref 3.9–10.3)
lymph#: 2 10*3/uL (ref 0.9–3.3)

## 2012-12-22 LAB — COMPREHENSIVE METABOLIC PANEL (CC13)
Albumin: 3.6 g/dL (ref 3.5–5.0)
Alkaline Phosphatase: 86 U/L (ref 40–150)
BUN: 32.1 mg/dL — ABNORMAL HIGH (ref 7.0–26.0)
CO2: 25 mEq/L (ref 22–29)
Calcium: 9.8 mg/dL (ref 8.4–10.4)
Chloride: 108 mEq/L — ABNORMAL HIGH (ref 98–107)
Glucose: 120 mg/dl — ABNORMAL HIGH (ref 70–99)
Potassium: 4.4 mEq/L (ref 3.5–5.1)
Sodium: 146 mEq/L — ABNORMAL HIGH (ref 136–145)
Total Protein: 6.9 g/dL (ref 6.4–8.3)

## 2012-12-22 LAB — FERRITIN: Ferritin: 98 ng/mL (ref 10–291)

## 2012-12-22 NOTE — Progress Notes (Signed)
This office note has been dictated.  [id number]

## 2012-12-23 NOTE — Progress Notes (Signed)
CC:   Ronaldo Miyamoto, M.D. Leighton Ruff. Redmond Pulling, MD, FACS [name]. [name], MD, [name], [place]  PROBLEM LIST:  1. Iron deficiency anemia associated with pagophagia felt to be most  likely due to severe bleeding from external hemorrhoids.  The patient underwent hemorrhoidectomy and banding on 09/17/2010. The patient received IV Feraheme 510 mg on 06/11/2010, 06/18/2010, 07/17/2010, 07/24/2010, 07/31/2010, and 07/03/2012.  Stools were negative for occult blood in September 2011.  The patient underwent endoscopy and colonoscopy on 10/06/2008.  2. History of GI bleeding in January of 2010.  3. History of vitamin B12 deficiency. On 10/10/2008 vitamin B12 level  was 186.  4. Renal insufficiency.  5. Obstructive sleep apnea.  6. Diabetes mellitus.  7. Hypertension.  8. Hypothyroidism.  9. Obesity.  10. Osteoarthritis involving the low back, knees and shoulders with  chronic pain.  11. History of depression in 1996 through 1999.  12. Gout with involvement of left great toe in June and in August 2013.   MEDICATIONS:  Reviewed and recorded. Current Outpatient Prescriptions  Medication Sig Dispense Refill  . acetaminophen (TYLENOL) 500 MG tablet Take 500 mg by mouth every 6 (six) hours as needed for pain.      . Calcium Carbonate-Vitamin D (CALCIUM + D PO) Take 600 mg by mouth.      . diphenhydrAMINE (BENADRYL) 25 MG tablet Take 25 mg by mouth every 6 (six) hours as needed.      . docusate sodium (COLACE) 100 MG capsule Take 100 mg by mouth 2 (two) times daily as needed.      . ezetimibe-simvastatin (VYTORIN) 10-40 MG per tablet Take 1 tablet by mouth at bedtime.      . febuxostat (ULORIC) 40 MG tablet Take 40 mg by mouth daily.       . furosemide (LASIX) 40 MG tablet 40 mg as needed.       Marland Kitchen levothyroxine (SYNTHROID, LEVOTHROID) 100 MCG tablet Take 100 mcg by mouth daily.      Marland Kitchen losartan (COZAAR) 50 MG tablet Take 50 mg by mouth 2 (two) times daily.      . Melatonin 5 MG CAPS Take by mouth  as needed.      . metFORMIN (GLUCOPHAGE-XR) 500 MG 24 hr tablet Take 500 mg by mouth 4 (four) times daily.      . ranitidine (ZANTAC) 150 MG tablet Take 150 mg by mouth at bedtime.      . sitaGLIPtin (JANUVIA) 100 MG tablet Take 100 mg by mouth daily.      Marland Kitchen tolterodine (DETROL LA) 4 MG 24 hr capsule Take 4 mg by mouth daily.      . traMADol (ULTRAM) 50 MG tablet Take 50 mg by mouth every 6 (six) hours as needed.       No current facility-administered medications for this visit.    SMOKING HISTORY:  The patient has never smoked cigarettes.  HISTORY:  [name] was seen today for followup of her iron- deficiency anemia which in the past was due to hemorrhoidal bleeding. The patient had been doing well when I last saw her on 06/23/2012; however, at that time her ferritin was continuing to slowly decrease and came back 42 on that date.  The patient had been on oral iron and on 03/17/2012 her ferritin was 148. On 06/23/2012 ferritin was 42.  We gave the patient IV Feraheme 510 mg on 07/03/2012.  In addition, the patient was noted to have a vitamin B12 level of  255 on 06/23/2012.  We recommended the patient take oral vitamin B12 1000 mcg every other day.  She continued this for 3 or 4 months, but has not taken vitamin B12 now for several months.  She is no longer on oral iron.  She denies any obvious blood in her stools.  She denies pagophagia.  She had been given stool cards on her last visit here, but has not submitted these.  The patient tells me she fell at school.  She is a bus Geophysicist/field seismologist.  She did not sustain any serious injuries, but she does have some musculoskeletal soreness.  The patient has not had mammograms in several years.  We have strongly recommended that she follow through with this.  We have recommended mammograms to her on previous visits.  PHYSICAL EXAMINATION:  General:  The patient shows no major changes. She looks generally well.  Weight is 268 pounds 9.6 ounces,  height 5 feet 9 inches, body surface area 2.44 sq m.  Vital Signs:  Blood pressure 127/83.  Other vital signs are normal.  HEENT:  There is no scleral icterus.  Mouth and pharynx are benign.  There is no peripheral adenopathy palpable.  Lungs:  Clear to percussion and auscultation. Cardiac:  Regular rhythm with soft systolic ejection murmur.  Breasts: Not examined.  Once again patient was urged to follow through with mammograms.  Abdomen:  Obese, nontender, with no organomegaly or masses palpable.  Extremities:  Puffy with minimal pitting.  Neurologic:  Exam was normal.  LABORATORY DATA:  From today, white count 7.8, ANC 4.6, hemoglobin 12.5, hematocrit 36.8, platelets 274,000.  Chemistries today notable for a BUN of 32, creatinine 1.7, and a glucose of 120.  Albumin was 3.6, LDH 159. On 09/25/2012 ferritin was 272 and vitamin B12 level 944.  On 06/23/2012 ferritin was 42 and vitamin B12 level 255.  Iron studies and vitamin B12 level are pending today.  IMAGING STUDIES:  1. CT scan of abdomen and pelvis without IV contrast on 10/10/2008  showed no significant findings. There was a small ventral hernia,  and a benign appearing renal cyst on the right. No cause of  bleeding was identified.  2. Chest x-ray, 2 view, from 09/14/2010 was negative.  3. Ultrasound of the kidneys from 09/22/2011 showed no hydronephrosis.  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   IMPRESSION AND PLAN:  [place] seems to be doing well at the present time. As stated above, she did require IV Feraheme 510 mg on 07/03/2012.  She may have some very slow bleeding.  She no longer has hemorrhoidal bleeding.  She also has a history of previous vitamin B12 deficiency.  I have urged the patient to resume her vitamin B12 1000 mcg every other day.  She can stay off the oral iron.  We will follow her CBC and ferritin in 3 months and plan to see Decklyn again in 6 months,  at which time we will check CBC, chemistries, iron studies, and vitamin B12 level.  We will supplement the patient with IV iron as needed.  Once again, I have strongly urged her to follow through with mammograms. She does have the stool cards and I have asked her to submit those as well.    ______________________________ Jeanie Cooks, M.D. DSM/MEDQ  D:  12/22/2012  T:  12/23/2012  Job:  [id number]

## 2012-12-24 ENCOUNTER — Encounter: Payer: Self-pay | Admitting: Oncology

## 2012-12-24 NOTE — Progress Notes (Signed)
Ferritin on 12/22/2012 came back 98. This patient had received IV Feraheme 510 mg on 07/03/2012 when her ferritin came back 42 on 06/23/2012.  This patient has been given stool cards but has not returned them. Stools were negative for occult blood in September 2011. The patient apparently had a GI workup by Dr. [name] in January 2010.  We will go ahead and give the patient IV Feraheme 510 mg about 12/29/2012.   We will refer her back to Dr. Verl Blalock for reassessment.

## 2012-12-25 ENCOUNTER — Other Ambulatory Visit: Payer: Self-pay | Admitting: Medical Oncology

## 2012-12-25 ENCOUNTER — Telehealth: Payer: Self-pay | Admitting: Medical Oncology

## 2012-12-25 ENCOUNTER — Telehealth: Payer: Self-pay | Admitting: Oncology

## 2012-12-25 DIAGNOSIS — D509 Iron deficiency anemia, unspecified: Secondary | ICD-10-CM

## 2012-12-25 NOTE — Telephone Encounter (Signed)
I called and left pt a message that Dr. Ralene Ok is setting her up for a feraheme infusion. Her ferritin has dropped to 98. He also is referring her back to see Dr. Verl Blalock due to her anemia. I informed her the schedulers will call her with an appointment. I asked her to call me if she has any questions or concerns.

## 2012-12-25 NOTE — Telephone Encounter (Signed)
lmonvm for pt re appt for 4/4 @ 2:15pm. Also gv appt for 6/27 and 10/3. Pt to get schedule when she comes in.

## 2012-12-29 ENCOUNTER — Telehealth: Payer: Self-pay | Admitting: Oncology

## 2012-12-29 ENCOUNTER — Ambulatory Visit (HOSPITAL_BASED_OUTPATIENT_CLINIC_OR_DEPARTMENT_OTHER): Payer: BC Managed Care – PPO

## 2012-12-29 VITALS — BP 117/74 | HR 73 | Temp 98.1°F | Resp 16

## 2012-12-29 DIAGNOSIS — D509 Iron deficiency anemia, unspecified: Secondary | ICD-10-CM

## 2012-12-29 MED ORDER — FERUMOXYTOL INJECTION 510 MG/17 ML
510.0000 mg | Freq: Once | INTRAVENOUS | Status: AC
  Administered 2012-12-29: 510 mg via INTRAVENOUS
  Filled 2012-12-29: qty 17

## 2012-12-29 MED ORDER — SODIUM CHLORIDE 0.9 % IV SOLN
Freq: Once | INTRAVENOUS | Status: AC
  Administered 2012-12-29: 15:00:00 via INTRAVENOUS

## 2012-12-29 MED ORDER — HEPARIN SOD (PORK) LOCK FLUSH 100 UNIT/ML IV SOLN
250.0000 [IU] | Freq: Once | INTRAVENOUS | Status: DC | PRN
  Filled 2012-12-29: qty 5

## 2012-12-29 NOTE — Patient Instructions (Addendum)
Ferumoxytol injection What is this medicine? FERUMOXYTOL is an iron complex. Iron is used to make healthy red blood cells, which carry oxygen and nutrients throughout the body. This medicine is used to treat iron deficiency anemia in people with chronic kidney disease. This medicine may be used for other purposes; ask your health care provider or pharmacist if you have questions. What should I tell my health care provider before I take this medicine? They need to know if you have any of these conditions: -anemia not caused by low iron levels -high levels of iron in the blood -magnetic resonance imaging (MRI) test scheduled -an unusual or allergic reaction to iron, other medicines, foods, dyes, or preservatives -pregnant or trying to get pregnant -breast-feeding How should I use this medicine? This medicine is for infusion into a vein. It is given by a health care professional in a hospital or clinic setting. Talk to your pediatrician regarding the use of this medicine in children. Special care may be needed. Overdosage: If you think you've taken too much of this medicine contact a poison control center or emergency room at once. Overdosage: If you think you have taken too much of this medicine contact a poison control center or emergency room at once. NOTE: This medicine is only for you. Do not share this medicine with others. What if I miss a dose? It is important not to miss your dose. Call your doctor or health care professional if you are unable to keep an appointment. What may interact with this medicine? This medicine may interact with the following medications: -other iron products This list may not describe all possible interactions. Give your health care provider a list of all the medicines, herbs, non-prescription drugs, or dietary supplements you use. Also tell them if you smoke, drink alcohol, or use illegal drugs. Some items may interact with your medicine. What should I watch  for while using this medicine? Visit your doctor or healthcare professional regularly. Tell your doctor or healthcare professional if your symptoms do not start to get better or if they get worse. You may need blood work done while you are taking this medicine. You may need to follow a special diet. Talk to your doctor. Foods that contain iron include: whole grains/cereals, dried fruits, beans, or peas, leafy green vegetables, and organ meats (liver, kidney). What side effects may I notice from receiving this medicine? Side effects that you should report to your doctor or health care professional as soon as possible: -allergic reactions like skin rash, itching or hives, swelling of the face, lips, or tongue -breathing problems -changes in blood pressure -feeling faint or lightheaded, falls -fever or chills -flushing, sweating, or hot feelings -swelling of the ankles or feet Side effects that usually do not require medical attention (Report these to your doctor or health care professional if they continue or are bothersome.): -diarrhea -headache -nausea, vomiting -stomach pain This list may not describe all possible side effects. Call your doctor for medical advice about side effects. You may report side effects to FDA at 1-800-FDA-1088. Where should I keep my medicine? This drug is given in a hospital or clinic and will not be stored at home. NOTE: This sheet is a summary. It may not cover all possible information. If you have questions about this medicine, talk to your doctor, pharmacist, or health care provider.  2012, Elsevier/Gold Standard. (06/05/2008 9:48:25 PM) 

## 2012-12-29 NOTE — Telephone Encounter (Signed)
Gave pt appt for lab and MD on October 2014 and lab on June 2014

## 2013-01-14 ENCOUNTER — Encounter: Payer: Self-pay | Admitting: Oncology

## 2013-01-14 NOTE — Progress Notes (Signed)
This patient was seen for her 6 month followup by Dr. Corliss Parish on 01/02/2013 and we received a copy of that office note. Followup appointment with Dr. Moshe Cipro will be in 6 months.

## 2013-01-16 ENCOUNTER — Ambulatory Visit (HOSPITAL_BASED_OUTPATIENT_CLINIC_OR_DEPARTMENT_OTHER): Payer: BC Managed Care – PPO | Admitting: Lab

## 2013-01-16 ENCOUNTER — Telehealth: Payer: Self-pay | Admitting: Medical Oncology

## 2013-01-16 DIAGNOSIS — D509 Iron deficiency anemia, unspecified: Secondary | ICD-10-CM

## 2013-01-16 NOTE — Telephone Encounter (Signed)
Left pt a message that stool cards x 3 were negative.

## 2013-03-05 ENCOUNTER — Telehealth: Payer: Self-pay | Admitting: Oncology

## 2013-03-05 NOTE — Telephone Encounter (Signed)
lmonvm for pt re appt w/Dr. [name] 6/17. Mailed schedule........Marland Kitchen Working referral workque.

## 2013-03-05 NOTE — Telephone Encounter (Signed)
Pt called back to r/s GI appt. Called [facility] and moved appt from 6/17 to 6/24 @ 10:45am. Pt has new d/t.

## 2013-03-13 ENCOUNTER — Ambulatory Visit: Payer: BC Managed Care – PPO | Admitting: Gastroenterology

## 2013-03-15 ENCOUNTER — Encounter: Payer: Self-pay | Admitting: *Deleted

## 2013-03-20 ENCOUNTER — Ambulatory Visit (INDEPENDENT_AMBULATORY_CARE_PROVIDER_SITE_OTHER): Payer: BC Managed Care – PPO | Admitting: Gastroenterology

## 2013-03-20 ENCOUNTER — Other Ambulatory Visit: Payer: BC Managed Care – PPO

## 2013-03-20 ENCOUNTER — Encounter: Payer: Self-pay | Admitting: Gastroenterology

## 2013-03-20 VITALS — BP 134/78 | HR 81 | Ht 69.0 in | Wt 279.0 lb

## 2013-03-20 DIAGNOSIS — D649 Anemia, unspecified: Secondary | ICD-10-CM

## 2013-03-20 DIAGNOSIS — D509 Iron deficiency anemia, unspecified: Secondary | ICD-10-CM

## 2013-03-20 DIAGNOSIS — K5900 Constipation, unspecified: Secondary | ICD-10-CM

## 2013-03-20 DIAGNOSIS — K439 Ventral hernia without obstruction or gangrene: Secondary | ICD-10-CM

## 2013-03-20 DIAGNOSIS — E119 Type 2 diabetes mellitus without complications: Secondary | ICD-10-CM

## 2013-03-20 DIAGNOSIS — K644 Residual hemorrhoidal skin tags: Secondary | ICD-10-CM

## 2013-03-20 MED ORDER — MOVIPREP 100 G PO SOLR: 1.0000 | Freq: Once | ORAL | Status: DC

## 2013-03-20 NOTE — Patient Instructions (Addendum)
  You have been scheduled for an endoscopy and colonoscopy with propofol. Please follow the written instructions given to you at your visit today. Please pick up your prep at the pharmacy within the next 1-3 days. If you use inhalers (even only as needed), please bring them with you on the day of your procedure. Your physician has requested that you go to www.startemmi.com and enter the access code given to you at your visit today. This web site gives a general overview about your procedure. However, you should still follow specific instructions given to you by our office regarding your preparation for the procedure.  Your physician has requested that you go to the basement for the following lab work before leaving today: Celiac Panel  ___________________________________________________________                                               We are excited to introduce MyChart, a new best-in-class service that provides you online access to important information in your electronic medical record. We want to make it easier for you to view your health information - all in one secure location - when and where you need it. We expect MyChart will enhance the quality of care and service we provide.  When you register for MyChart, you can:    View your test results.    Request appointments and receive appointment reminders via email.    Request medication renewals.    View your medical history, allergies, medications and immunizations.    Communicate with your physician's office through a password-protected site.    Conveniently print information such as your medication lists.  To find out if MyChart is right for you, please talk to a member of our clinical staff today. We will gladly answer your questions about this free health and wellness tool.  If you are age 65 or older and want a member of your family to have access to your record, you must provide written consent by completing a proxy form  available at our office. Please speak to our clinical staff about guidelines regarding accounts for patients younger than age 65.  As you activate your MyChart account and need any technical assistance, please call the MyChart technical support line at [phone]-CHART [phone]) or email your question to [email or web address]. If you email your question(s), please include your name, a return phone number and the best time to reach you.  If you have non-urgent health-related questions, you can send a message to our office through [place] at [email or web address]. If you have a medical emergency, call 911.  Thank you for using MyChart as your new health and wellness resource!   MyChart licensed from [employer],  1999-2010. Patents Pending.

## 2013-03-20 NOTE — Progress Notes (Signed)
History of Present Illness:  This is a denies a very complex 67 year old Caucasian female with adult onset diabetes, hypertensive cardiovascular disease, B12 deficiency, thyroid dysfunction, serum calcium deficiency, whose has severe idiopathic iron deficiency anemia for approximately 7-8 years without any definitive diagnosis.  She actually in the past as had endoscopy and colonoscopy by Dr. Jim Desanctis, last performed 5 years ago, and apparently also has had pill camera exam which was unremarkable.  Review of his record shows no evidence of any specific etiology of iron deficiency.  I cannot find where she's been checked previously for celiac disease although I saw her in consultation several years ago, and this was an etiologic consideration.  She has no other symptoms of chronic malabsorption however.  The patient has regular bowel movements without melena or hematochezia, denies dyspepsia, reflux symptoms, change in bowel habits, or any history of known gallbladder liver disease.  She receives periodic iron transfusions by Dr. Ralene Ok hematology-oncology, and is taking ferrous sulfate 325 mg twice a day..  Patient does have mild chronic constipation is on Colace, oral iron therapy, Synthroid, Zantac on and 50 mg a day, and oral diabetic medications.  I cannot see where she is on aspirin or any anticoagulants.  She denies any symptoms of hypovolemia, denies any current cardiopulmonary or general medical symptoms.  Family history is noncontributory.  CT scan of the abdomen 5 years ago was unremarkable except for ventral hernia.  Repeat stool exams for occult blood have been negative.  As part of her workup she was found to have large bleeding external hemorrhoids, and underwent hemorrhoidectomy with Dr. Greer Pickerel in surgery in 2011.  Since that time a rectal bleeding as ceased, but she still has large perianal skin tags which occasional give her discomfort.   I have reviewed this patient's present history,  medical and surgical past history, allergies and medications.     ROS:   All systems were reviewed and are negative unless otherwise stated in the HPI.    Physical Exam: Blood pressure 134/78, pulse 81 and regular and weight 279 with a BMI of 41.18. General well developed well nourished patient in no acute distress, appearing their stated age Eyes PERRLA, no icterus, fundoscopic exam per opthamologist Skin no lesions noted Neck supple, no adenopathy, no thyroid enlargement, no tenderness Chest clear to percussion and auscultation Heart no significant murmurs, gallops or rubs noted Abdomen no hepatosplenomegaly masses or tenderness, BS normal.  There is abdominal ventral wall hernia noted.   Rectal inspection normal no fissures, or fistulae noted.  No masses or tenderness on digital exam.  There are posterior left lateral large skin tags noted bowel active external hemorrhoids were bleeding.  Digital exam shows no masses or tenderness and stool is guaiac-negative. Extremities no acute joint lesions, edema, phlebitis or evidence of cellulitis. Neurologic patient oriented x 3, cranial nerves intact, no focal neurologic deficits noted. Psychological mental status normal and normal affect.  Assessment and plan: Recurrent iron deficiency anemia of unexplained etiology.  I have ordered celiac serologies to exclude celiac disease.  Also we will repeat her endoscopy and colonoscopy, and at that time will obtain biopsies for H. pylori and also small bowel biopsy.  I think her hemorrhoids appear to be much improved from previous presurgical status, and that most of her perianal problems are related to large skin tags at this time.  We'll make appropriate adjustments in her diabetic medications for her procedures.  If her endoscopy and colonoscopy were unremarkable, we  will again repeat her pill camera small bowel examination.  Of course if she has celiac disease, dietary therapy should take care of her  problem.  Please copy her primary care physician, referring physician, and pertinent subspecialists.  Encounter Diagnosis  Name Primary?  Marland Kitchen Anemia Yes

## 2013-03-21 ENCOUNTER — Ambulatory Visit (AMBULATORY_SURGERY_CENTER): Payer: BC Managed Care – PPO | Admitting: Gastroenterology

## 2013-03-21 ENCOUNTER — Encounter: Payer: Self-pay | Admitting: Gastroenterology

## 2013-03-21 ENCOUNTER — Other Ambulatory Visit: Payer: Self-pay | Admitting: *Deleted

## 2013-03-21 VITALS — BP 154/73 | HR 70 | Temp 98.5°F | Resp 18 | Ht 69.0 in | Wt 279.0 lb

## 2013-03-21 DIAGNOSIS — K2990 Gastroduodenitis, unspecified, without bleeding: Secondary | ICD-10-CM

## 2013-03-21 DIAGNOSIS — D509 Iron deficiency anemia, unspecified: Secondary | ICD-10-CM

## 2013-03-21 DIAGNOSIS — D649 Anemia, unspecified: Secondary | ICD-10-CM

## 2013-03-21 DIAGNOSIS — Z1211 Encounter for screening for malignant neoplasm of colon: Secondary | ICD-10-CM

## 2013-03-21 DIAGNOSIS — K2970 Gastritis, unspecified, without bleeding: Secondary | ICD-10-CM

## 2013-03-21 DIAGNOSIS — K625 Hemorrhage of anus and rectum: Secondary | ICD-10-CM

## 2013-03-21 DIAGNOSIS — K5900 Constipation, unspecified: Secondary | ICD-10-CM

## 2013-03-21 DIAGNOSIS — K2980 Duodenitis without bleeding: Secondary | ICD-10-CM

## 2013-03-21 LAB — GLUCOSE, CAPILLARY: Glucose-Capillary: 89 mg/dL (ref 70–99)

## 2013-03-21 LAB — CELIAC PANEL 10
Endomysial Screen: NEGATIVE
Tissue Transglut Ab: 8.6 U/mL (ref <20)

## 2013-03-21 MED ORDER — ESOMEPRAZOLE MAGNESIUM 40 MG PO CPDR: 40.0000 mg | DELAYED_RELEASE_CAPSULE | Freq: Every day | ORAL | Status: DC

## 2013-03-21 MED ORDER — SODIUM CHLORIDE 0.9 % IV SOLN: 500.0000 mL | INTRAVENOUS | Status: DC

## 2013-03-21 NOTE — Op Note (Signed)
[place]  [address] [place], [address]   COLONOSCOPY PROCEDURE REPORT  PATIENT: [name], Dobner  MR#: [id number] BIRTHDATE: Nov 02, 1945 , 67  yrs. old GENDER: Female ENDOSCOPIST: Sable Feil, MD, [facility] REFERRED BY:  Santiago Bur, M.D. PROCEDURE DATE:  03/21/2013 PROCEDURE:   Colonoscopy, screening ASA CLASS:   Class III INDICATIONS:Average risk patient for colon cancer and Iron Deficiency Anemia. MEDICATIONS: propofol (Diprivan) 250mg  IV  DESCRIPTION OF PROCEDURE:   After the risks and benefits and of the procedure were explained, informed consent was obtained.  A digital rectal exam revealed no abnormalities of the rectum.    The LB [id number]  endoscope was introduced through the anus and advanced to the cecum, which was identified by both the appendix and ileocecal valve .  The quality of the prep was excellent, using MoviPrep .  The instrument was then slowly withdrawn as the colon was fully examined.     COLON FINDINGS: A normal appearing cecum, ileocecal valve, and appendiceal orifice were identified.  The ascending, hepatic flexure, transverse, splenic flexure, descending, sigmoid colon and rectum appeared unremarkable.  No polyps or cancers were seen. Changes of melanosis coli noted.     Retroflexed views revealed no abnormalities.     The scope was then withdrawn from the patient and the procedure completed.  COMPLICATIONS: There were no complications. ENDOSCOPIC IMPRESSION: Normal colon .Marland Kitchenno AVM's,polyps ,colitis or other causes of chronic blood loss noted.  RECOMMENDATIONS: 1.  Continue current medications 2.  Upper endoscopy will be scheduled 3.  Continue current colorectal screening recommendations for "routine risk" patients with a repeat colonoscopy in 10 years.   REPEAT EXAM:  cc:  _______________________________ eSignedSable Feil, MD, [facility] 03/21/2013 3:25 PM     PATIENT NAME:  [name], [name] MR#: [id number]

## 2013-03-21 NOTE — Op Note (Signed)
[place]  [address] [place], [id number]   ENDOSCOPY PROCEDURE REPORT  PATIENT: Tara, Saunders  MR#: [id number] BIRTHDATE: August 22, 1946 , 67  yrs. old GENDER: Female ENDOSCOPIST:Deval Mroczka Consuello Masse, MD, [facility] REFERRED BY: Santiago Bur, M.D. PROCEDURE DATE:  03/21/2013 PROCEDURE:   EGD w/ biopsy and EGD w/ biopsy for H.pylori ASA CLASS:    Class III INDICATIONS: Iron deficiency anemia. MEDICATION: There was residual sedation effect present from prior procedure and propofol (Diprivan) 150mg  IV TOPICAL ANESTHETIC:  DESCRIPTION OF PROCEDURE:   After the risks and benefits of the procedure were explained, informed consent was obtained.  The LB [id number]  endoscope was introduced through the mouth  and advanced to the second portion of the duodenum .  The instrument was slowly withdrawn as the mucosa was fully examined.      DUODENUM: The duodenal mucosa showed no abnormalities in the bulb and second portion of the duodenum.  Cold forceps biopsies were taken in the bulb and second portion.  STOMACH: There was mild antral gastropathy noted.  Cold forcep biopsies were taken at the antrum.for CLO test and regular biopsies.  ESOPHAGUS: The mucosa of the esophagus appeared normal. Retroflexed views revealed no abnormalities.    The scope was then withdrawn from the patient and the procedure completed.  COMPLICATIONS: There were no complications.   ENDOSCOPIC IMPRESSION: 1.   The duodenal mucosa showed no abnormalities in the bulb and second portion of the duodenum...r/o celiadc disease 2.   There was mild antral gastropathy noted [T2] .Marland Kitchen?? H,pylori infection 3.   The mucosa of the esophagus appeared normal  RECOMMENDATIONS: 1.  Await pathology results 2.  Continue current medications 3.  PPI qam 4.  Rx CLO if positive..consider pill camera exam if workup for H. pylori gastritis is negative.      _______________________________ eSignedSable Feil, MD, [facility] 03/21/2013 3:32 PM   standard discharge   PATIENT NAME:  Tara, Saunders MR#: [id number]

## 2013-03-21 NOTE — Progress Notes (Signed)
Report to pacu rn, vss, bbs=clear 

## 2013-03-21 NOTE — Progress Notes (Signed)
Patient did not experience any of the following events: a burn prior to discharge; a fall within the facility; wrong site/side/patient/procedure/implant event; or a hospital transfer or hospital admission upon discharge from the facility. (G8907) Patient did not have preoperative order for IV antibiotic SSI prophylaxis. (G8918)  

## 2013-03-21 NOTE — Patient Instructions (Signed)
YOU HAD AN ENDOSCOPIC PROCEDURE TODAY AT THE [facility]: Refer to the procedure report that was given to you for any specific questions about what was found during the examination.  If the procedure report does not answer your questions, please call your gastroenterologist to clarify.  If you requested that your care partner not be given the details of your procedure findings, then the procedure report has been included in a sealed envelope for you to review at your convenience later.  YOU SHOULD EXPECT: Some feelings of bloating in the abdomen. Passage of more gas than usual.  Walking can help get rid of the air that was put into your GI tract during the procedure and reduce the bloating. If you had a lower endoscopy (such as a colonoscopy or flexible sigmoidoscopy) you may notice spotting of blood in your stool or on the toilet paper. If you underwent a bowel prep for your procedure, then you may not have a normal bowel movement for a few days.  DIET: Your first meal following the procedure should be a light meal and then it is ok to progress to your normal diet.  A half-sandwich or bowl of soup is an example of a good first meal.  Heavy or fried foods are harder to digest and may make you feel nauseous or bloated.  Likewise meals heavy in dairy and vegetables can cause extra gas to form and this can also increase the bloating.  Drink plenty of fluids but you should avoid alcoholic beverages for 24 hours.  ACTIVITY: Your care partner should take you home directly after the procedure.  You should plan to take it easy, moving slowly for the rest of the day.  You can resume normal activity the day after the procedure however you should NOT DRIVE or use heavy machinery for 24 hours (because of the sedation medicines used during the test).    SYMPTOMS TO REPORT IMMEDIATELY: A gastroenterologist can be reached at any hour.  During normal business hours, 8:30 AM to 5:00 PM Monday through Friday,  call [phone].  After hours and on weekends, please call the GI answering service at [phone] who will take a message and have the physician on call contact you.   Following lower endoscopy (colonoscopy or flexible sigmoidoscopy):  Excessive amounts of blood in the stool  Significant tenderness or worsening of abdominal pains  Swelling of the abdomen that is new, acute  Fever of 100F or higher  Following upper endoscopy (EGD)  Vomiting of blood or coffee ground material  New chest pain or pain under the shoulder blades  Painful or persistently difficult swallowing  New shortness of breath  Fever of 100F or higher  Black, tarry-looking stools  FOLLOW UP: If any biopsies were taken you will be contacted by phone or by letter within the next 1-3 weeks.  Call your gastroenterologist if you have not heard about the biopsies in 3 weeks.  Our staff will call the home number listed on your records the next business day following your procedure to check on you and address any questions or concerns that you may have at that time regarding the information given to you following your procedure. This is a courtesy call and so if there is no answer at the home number and we have not heard from you through the emergency physician on call, we will assume that you have returned to your regular daily activities without incident.  SIGNATURES/CONFIDENTIALITY: You and/or your care   partner have signed paperwork which will be entered into your electronic medical record.  These signatures attest to the fact that that the information above on your After Visit Summary has been reviewed and is understood.  Full responsibility of the confidentiality of this discharge information lies with you and/or your care-partner.   nexium samples (2 boxes of 5 is all they had) & coupon given to you by Dr. [name]  Take nexium 40 mg one every am 30 min before first meal of the day  Await biopsy results

## 2013-03-21 NOTE — Progress Notes (Signed)
Called to room to assist during endoscopic procedure.  Patient ID and intended procedure confirmed with present staff. Received instructions for my participation in the procedure from the performing physician.  

## 2013-03-22 ENCOUNTER — Telehealth: Payer: Self-pay | Admitting: *Deleted

## 2013-03-22 NOTE — Telephone Encounter (Signed)
  Follow up Call-  Call back number 03/21/2013  Post procedure Call Back phone  # [phone]  Permission to leave phone message Yes    [facility]

## 2013-03-23 ENCOUNTER — Other Ambulatory Visit: Payer: Self-pay | Admitting: Endocrinology

## 2013-03-23 ENCOUNTER — Other Ambulatory Visit (HOSPITAL_BASED_OUTPATIENT_CLINIC_OR_DEPARTMENT_OTHER): Payer: Medicare Other | Admitting: Lab

## 2013-03-23 ENCOUNTER — Encounter: Payer: Self-pay | Admitting: Gastroenterology

## 2013-03-23 ENCOUNTER — Other Ambulatory Visit (HOSPITAL_COMMUNITY)
Admission: RE | Admit: 2013-03-23 | Discharge: 2013-03-23 | Disposition: A | Discharge status: Home / Self Care | Payer: BC Managed Care – PPO | Source: Ambulatory Visit | Attending: Emergency Medicine | Admitting: Emergency Medicine

## 2013-03-23 DIAGNOSIS — Z01419 Encounter for gynecological examination (general) (routine) without abnormal findings: Secondary | ICD-10-CM | POA: Insufficient documentation

## 2013-03-23 DIAGNOSIS — D509 Iron deficiency anemia, unspecified: Secondary | ICD-10-CM

## 2013-03-23 LAB — CBC WITH DIFFERENTIAL/PLATELET
BASO%: 0.3 % (ref 0.0–2.0)
Basophils Absolute: 0 10*3/uL (ref 0.0–0.1)
HCT: 36.7 % (ref 34.8–46.6)
HGB: 12.6 g/dL (ref 11.6–15.9)
LYMPH%: 24.1 % (ref 14.0–49.7)
MCHC: 34.3 g/dL (ref 31.5–36.0)
MONO#: 0.5 10*3/uL (ref 0.1–0.9)
NEUT%: 64.4 % (ref 38.4–76.8)
Platelets: 240 10*3/uL (ref 145–400)
WBC: 6.6 10*3/uL (ref 3.9–10.3)
lymph#: 1.6 10*3/uL (ref 0.9–3.3)

## 2013-03-26 ENCOUNTER — Encounter: Payer: Self-pay | Admitting: Gastroenterology

## 2013-03-27 ENCOUNTER — Telehealth: Payer: Self-pay | Admitting: Gastroenterology

## 2013-03-27 ENCOUNTER — Other Ambulatory Visit: Payer: Self-pay

## 2013-03-27 DIAGNOSIS — Z1231 Encounter for screening mammogram for malignant neoplasm of breast: Secondary | ICD-10-CM

## 2013-03-27 NOTE — Telephone Encounter (Signed)
Informed pt she may stop Zantac, but continue Nexium. Asked her about scheduling the Pill Capsule and she would rather wait to see how much she owes for her EGD; states she had one 3 years ago. As it stands, she will have labs in October prior to seeing her new hem/onc md and decide then about whether to have the Capsule Endo.

## 2013-04-17 ENCOUNTER — Ambulatory Visit
Admission: RE | Admit: 2013-04-17 | Discharge: 2013-04-17 | Disposition: A | Discharge status: Home / Self Care | Payer: BC Managed Care – PPO | Source: Ambulatory Visit

## 2013-04-17 DIAGNOSIS — Z1231 Encounter for screening mammogram for malignant neoplasm of breast: Secondary | ICD-10-CM

## 2013-04-18 ENCOUNTER — Other Ambulatory Visit: Payer: Self-pay | Admitting: Endocrinology

## 2013-04-18 DIAGNOSIS — R928 Other abnormal and inconclusive findings on diagnostic imaging of breast: Secondary | ICD-10-CM

## 2013-05-03 ENCOUNTER — Ambulatory Visit
Admission: RE | Admit: 2013-05-03 | Discharge: 2013-05-03 | Disposition: A | Discharge status: Home / Self Care | Payer: BC Managed Care – PPO | Source: Ambulatory Visit | Attending: Endocrinology | Admitting: Endocrinology

## 2013-05-03 DIAGNOSIS — R928 Other abnormal and inconclusive findings on diagnostic imaging of breast: Secondary | ICD-10-CM

## 2013-06-29 ENCOUNTER — Other Ambulatory Visit: Payer: Self-pay | Admitting: Medical Oncology

## 2013-06-29 ENCOUNTER — Ambulatory Visit: Payer: BC Managed Care – PPO | Admitting: Oncology

## 2013-06-29 ENCOUNTER — Other Ambulatory Visit: Payer: BC Managed Care – PPO | Admitting: Lab

## 2013-06-29 DIAGNOSIS — E538 Deficiency of other specified B group vitamins: Secondary | ICD-10-CM

## 2013-06-29 DIAGNOSIS — D509 Iron deficiency anemia, unspecified: Secondary | ICD-10-CM

## 2013-07-02 ENCOUNTER — Encounter: Payer: Self-pay | Admitting: Internal Medicine

## 2013-07-02 ENCOUNTER — Telehealth: Payer: Self-pay | Admitting: Internal Medicine

## 2013-07-02 ENCOUNTER — Other Ambulatory Visit (HOSPITAL_BASED_OUTPATIENT_CLINIC_OR_DEPARTMENT_OTHER): Payer: Medicare Other | Admitting: Lab

## 2013-07-02 ENCOUNTER — Ambulatory Visit (HOSPITAL_BASED_OUTPATIENT_CLINIC_OR_DEPARTMENT_OTHER): Payer: Medicare Other | Admitting: Internal Medicine

## 2013-07-02 VITALS — BP 131/83 | HR 73 | Temp 98.7°F | Resp 19 | Ht 69.0 in | Wt 282.0 lb

## 2013-07-02 DIAGNOSIS — D509 Iron deficiency anemia, unspecified: Secondary | ICD-10-CM

## 2013-07-02 DIAGNOSIS — E538 Deficiency of other specified B group vitamins: Secondary | ICD-10-CM

## 2013-07-02 DIAGNOSIS — K648 Other hemorrhoids: Secondary | ICD-10-CM

## 2013-07-02 DIAGNOSIS — K644 Residual hemorrhoidal skin tags: Secondary | ICD-10-CM

## 2013-07-02 LAB — CBC WITH DIFFERENTIAL/PLATELET
BASO%: 0.5 % (ref 0.0–2.0)
Eosinophils Absolute: 0.2 10*3/uL (ref 0.0–0.5)
HCT: 38.8 % (ref 34.8–46.6)
HGB: 13.2 g/dL (ref 11.6–15.9)
MCHC: 34 g/dL (ref 31.5–36.0)
MONO#: 0.6 10*3/uL (ref 0.1–0.9)
NEUT#: 4.4 10*3/uL (ref 1.5–6.5)
NEUT%: 62.9 % (ref 38.4–76.8)
RDW: 12.4 % (ref 11.2–14.5)
WBC: 7 10*3/uL (ref 3.9–10.3)
lymph#: 1.8 10*3/uL (ref 0.9–3.3)

## 2013-07-02 LAB — COMPREHENSIVE METABOLIC PANEL (CC13)
ALT: 13 U/L (ref 0–55)
CO2: 24 mEq/L (ref 22–29)
Calcium: 9.9 mg/dL (ref 8.4–10.4)
Chloride: 108 mEq/L (ref 98–109)
Creatinine: 1.6 mg/dL — ABNORMAL HIGH (ref 0.6–1.1)
Glucose: 112 mg/dl (ref 70–140)
Sodium: 143 mEq/L (ref 136–145)
Total Bilirubin: 0.3 mg/dL (ref 0.20–1.20)
Total Protein: 7.3 g/dL (ref 6.4–8.3)

## 2013-07-02 LAB — FERRITIN CHCC: Ferritin: 157 ng/ml (ref 9–269)

## 2013-07-02 LAB — IRON AND TIBC CHCC
%SAT: 42 % (ref 21–57)
Iron: 110 ug/dL (ref 41–142)

## 2013-07-02 LAB — VITAMIN B12: Vitamin B-12: 800 pg/mL (ref 211–911)

## 2013-07-02 NOTE — Patient Instructions (Signed)
Iron Deficiency Anemia There are many types of anemia. Iron deficiency anemia is the most common. Iron deficiency anemia is a decrease in the number of red blood cells caused by too little iron. Without enough iron, your body does not produce enough hemoglobin. Hemoglobin is a substance in red blood cells that carries oxygen to the body's tissues. Iron deficiency anemia may leave you tired and short of breath. CAUSES   Lack of iron in the diet.  This may be seen in infants and children, because there is little iron in milk.  This may be seen in adults who do not eat enough iron-rich foods.  This may be seen in pregnant or breastfeeding women who do not take iron supplements. There is a much higher need for iron intake at these times.  Poor absorption of iron, as seen with intestinal disorders.  Intestinal bleeding.  Heavy periods. SYMPTOMS  Mild anemia may not be noticeable. Symptoms may include:  Fatigue.  Headache.  Pale skin.  Weakness.  Shortness of breath.  Dizziness.  Cold hands and feet.  Fast or irregular heartbeat. DIAGNOSIS  Diagnosis requires a thorough evaluation and physical exam by your caregiver.  Blood tests are generally used to confirm iron deficiency anemia.  Additional tests may be done to find the underlying cause of your anemia. These may include:  Testing for blood in the stool (fecal occult blood test).  A procedure to see inside the colon and rectum (colonoscopy).  A procedure to see inside the esophagus and stomach (endoscopy). TREATMENT   Correcting the cause of the iron deficiency is the first step.  Medicines, such as oral contraceptives, can make heavy menstrual flows lighter.  Antibiotics and other medicines can be used to treat peptic ulcers.  Surgery may be needed to remove a bleeding polyp, tumor, or fibroid.  Often, iron supplements (ferrous sulfate) are taken.  For the best iron absorption, take these supplements with an  empty stomach.  You may need to take the supplements with food if you cannot tolerate them on an empty stomach. Vitamin C improves the absorption of iron. Your caregiver may recommend taking your iron tablets with a glass of orange juice or vitamin C supplement.  Milk and antacids should not be taken at the same time as iron supplements. They may interfere with the absorption of iron.  Iron supplements can cause constipation. A stool softener is often recommended.  Pregnant and breastfeeding women will need to take extra iron, because their normal diet usually will not provide the required amount.  Patients who cannot tolerate iron by mouth can take it through a vein (intravenously) or by an injection into the muscle. HOME CARE INSTRUCTIONS   Ask your dietitian for help with diet questions.  Take iron and vitamins as directed by your caregiver.  Eat a diet rich in iron. Eat liver, lean beef, whole-grain bread, eggs, dried fruit, and dark green leafy vegetables. SEEK IMMEDIATE MEDICAL CARE IF:   You have a fainting episode. Do not drive yourself. Call your local emergency services (911 in [place] if no other help is available.  You have chest pain, nausea, or vomiting.  You develop severe or increased shortness of breath with activities.  You develop weakness or increased thirst.  You have a rapid heartbeat.  You develop unexplained sweating or become lightheaded when getting up from a chair or bed. MAKE SURE YOU:   Understand these instructions.  Will watch your condition.  Will get help right away   if you are not doing well or get worse. Document Released: 09/10/2000 Document Revised: 12/06/2011 Document Reviewed: 01/20/2010 [facility] Patient Information 2014 [place]. Iron Chewable Tablets What is this medicine? IRON (AHY ern) replaces iron that is essential to healthy red blood cells. Iron is used to treat iron deficiency anemia. Anemia may cause problems like  tiredness, shortness of breath, or slowed growth in children. Only take iron if your doctor has told you to. Do not treat yourself with iron if you are feeling tired. Most healthy people get enough iron in their diets, particularly if they eat cereals, meat, poultry, and fish. This medicine may be used for other purposes; ask your health care provider or pharmacist if you have questions. What should I tell my health care provider before I take this medicine? They need to know if you have any of these conditions: -frequently drink alcohol -bowel disease -hemolytic anemia -iron overload (hemochromatosis, hemosiderosis) -liver disease -problems with swallowing -stomach ulcer or other stomach problems -an unusual or allergic reaction to iron, other medicines, foods, dyes, or preservatives -pregnant or trying to get pregnant -breast-feeding How should I use this medicine? Take this medicine by mouth. Chew it completely before swallowing. Follow the directions on the prescription label. Take this medicine in an upright or sitting position. Try to take any bedtime doses at least 10 minutes before lying down. You may take this medicine with food. Take your medicine at regular intervals. Do not take your medicine more often than directed. Do not stop taking except on your doctor's advice. Talk to your pediatrician regarding the use of this medicine in children. While this drug may be prescribed for even very young children for selected conditions, precautions do apply. Overdosage: If you think you have taken too much of this medicine contact a poison control center or emergency room at once. NOTE: This medicine is only for you. Do not share this medicine with others. What if I miss a dose? If you miss a dose, take it as soon as you can. If it is almost time for your next dose, take only that dose. Do not take double or extra doses. What may interact with this medicine? If you are taking this iron  product, you should not take iron in any other medicine or dietary supplement. This medicine may also interact with the following medications: -alendronate -antacids -cefdinir -chloramphenicol -cholestyramine -deferoxamine -dimercaprol -etidronate -medicines for stomach ulcers or other stomach problems -pancreatic enzymes -quinolone antibiotics (examples: Cipro, Floxin, Levaquin, Tequin and others) -risedronate -tetracycline antibiotics (examples: doxycycline, tetracycline, minocycline, and others) -thyroid hormones This list may not describe all possible interactions. Give your health care provider a list of all the medicines, herbs, non-prescription drugs, or dietary supplements you use. Also tell them if you smoke, drink alcohol, or use illegal drugs. Some items may interact with your medicine. What should I watch for while using this medicine? Use iron supplements only as directed by your health care professional. Dennis Bast will need important blood work while you are taking this medicine. It may take 3 to 6 months of therapy to treat low iron levels. Pregnant women should follow the dose and length of iron treatment as directed by their doctors. Do not use iron longer than prescribed, and do not take a higher dose than recommended. Long-term use may cause excess iron to build-up in the body. Do not take iron with antacids. If you need to take an antacid, take it 2 hours after a dose of iron.  What side effects may I notice from receiving this medicine? Side effects that you should report to your doctor or health care professional as soon as possible: -allergic reactions like skin rash, itching or hives, swelling of the face, lips, or tongue -blue lips, nails, or palms -dark colored stools (this may be due to the iron, but can indicate a more serious condition) -drowsiness -pain with or difficulty swallowing -pale or clammy skin -seizures -stomach pain -unusually weak or  tired -vomiting -weak, fast, or irregular heartbeat Side effects that usually do not require medical attention (report to your doctor or health care professional if they continue or are bothersome): -constipation -indigestion -nausea or stomach upset This list may not describe all possible side effects. Call your doctor for medical advice about side effects. You may report side effects to FDA at 1-800-FDA-1088. Where should I keep my medicine? Keep out of the reach of children. Even small amounts of iron can be harmful to a child. Store at room temperature between 15 and 30 degrees C (59 and 86 degrees F). Keep container tightly closed. Throw away any unused medicine after the expiration date. NOTE: This sheet is a summary. It may not cover all possible information. If you have questions about this medicine, talk to your doctor, pharmacist, or health care provider.  2012, [employer]vier/Gold Standard. (02/01/2008 5:08:28 PM)Ferritin This is done to test for anemia. Anemia occurs when the amount of hemoglobin (found in the red blood cells) drops below normal. Hemoglobin is necessary for the transportation of oxygen throughout the body. Blood tests may show a variety of common, treatable abnormalities that can lead to problems associated with anemia. Iron deficiency anemia is the most common of the anemias. It is usually due to bleeding. In women, iron deficiency may be due to heavy menstrual periods. In older women and in men, the bleeding is usually from disease of the intestines. In children and in pregnant women, the body needs more iron. Iron deficiency may be due to simply not eating enough iron in the diet. Iron deficiency may also result from some extreme diets. Treatment of iron deficiency usually involves iron supplements. In older women and in men, there is usually some further testing needed to determine why the person is iron deficient.  PREPARATION FOR TEST A blood sample is obtained by  inserting a needle into a vein in the arm. NORMAL FINDINGS Female: 12-300ng/ml or 12-300  g/L (SI units) Female:  10-150 ng/ml or 10-150  g/L (SI units) Children/adolescent:  Newborn: 25-200 ng/ml  1 month: 200-600 ng/ml  2-5 months: 50-200 ng/ml  6 months-15 years: 7-142 ng/ml Ranges for normal findings may vary among different laboratories and hospitals. You should always check with your doctor after having lab work or other tests done to discuss the meaning of your test results and whether your values are considered within normal limits. MEANING OF TEST  Your caregiver will go over the test results with you and discuss the importance and meaning of your results, as well as treatment options and the need for additional tests if necessary. OBTAINING THE TEST RESULTS  It is your responsibility to obtain your test results. Ask the lab or department performing the test when and how you will get your results. Document Released: 10/06/2004 Document Revised: 12/06/2011 Document Reviewed: 08/23/2008 [facility] Patient Information 2014 [place], [place].

## 2013-07-02 NOTE — Telephone Encounter (Signed)
Gave pt appt for lab and MD on January and April 2015

## 2013-07-03 NOTE — Progress Notes (Signed)
[facility] OFFICE PROGRESS NOTE  Dwan Bolt, MD [address] [place] [address]  DIAGNOSIS: ANEMIA, IRON DEFICIENCY - Plan: CBC with Differential, CBC with Differential, Ferritin, Iron and TIBC, Comprehensive metabolic panel, Ferritin, Iron and TIBC, CANCELED: Ferritin, CANCELED: Iron and TIBC  HEMORRHOIDS, INTERNAL - Plan: CBC with Differential, CBC with Differential, Ferritin, Iron and TIBC, Comprehensive metabolic panel, CANCELED: Ferritin, CANCELED: Iron and TIBC  HEMORRHOIDS, EXTERNAL - Plan: CBC with Differential, CBC with Differential, Ferritin, Iron and TIBC, Comprehensive metabolic panel, Ferritin, Iron and TIBC, CANCELED: Ferritin, CANCELED: Iron and TIBC  Chief Complaint  Patient presents with  . Anemia, iron deficiency    CURRENT THERAPY:  INTERVAL HISTORY: [name] 67 y.o. female with a history of iron-  deficiency anemia which in the past was due to hemorrhoidal bleeding presents for follow-up.  He was last seen by 12/22/2012.  Her main complaint is her fatigue today.  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  She reports recently having an EGD/Colonoscopy that was non-revealing.  She has a mammogram scheduled for this June.  She denies picca presently.  She also denies hematochezia or black tarry stools.  She denies hematuria.   MEDICAL HISTORY: Past Medical History  Diagnosis Date  . Pagophagia   . Anemia, iron deficiency   . Hemorrhoids, external   . H/O: GI bleed 09/2008  . Vitamin B12 deficiency   . Renal insufficiency   . Sleep apnea, obstructive     Does not use oxygen at bedtime   . DM (diabetes mellitus)   . Hypothyroidism   . Obesity   . Depression   . HTN (hypertension)   . OA (osteoarthritis)   . Carpal tunnel syndrome   . Gastritis     INTERIM HISTORY: has HYPOTHYROIDISM; DIABETES MELLITUS; VITAMIN B12 DEFICIENCY; HYPERLIPIDEMIA; ANEMIA, IRON  DEFICIENCY; ANXIETY; DEPRESSION; CARPAL TUNNEL SYNDROME, BILATERAL; HYPERTENSION; HEMORRHOIDS, INTERNAL; HEMORRHOIDS, EXTERNAL; ASTHMA; RECTAL BLEEDING; GASTROINTESTINAL HEMORRHAGE; OSTEOARTHRITIS; and SLEEP APNEA on her problem list.    ALLERGIES:  is allergic to other.  MEDICATIONS: has a current medication list which includes the following prescription(s): acetaminophen, calcium carbonate-vitamin d, diphenhydramine, docusate sodium, esomeprazole, ezetimibe-simvastatin, febuxostat, ferrous sulfate, furosemide, levothyroxine, losartan, melatonin, metformin, ranitidine, sitagliptin, tolterodine, tramadol, and vitamin b-12.  SURGICAL HISTORY:  Past Surgical History  Procedure Laterality Date  . Appendectomy    . Abdominal hysterectomy    . Hemorrhoid surgery    . Tonsillectomy    . Adenoidectomy      REVIEW OF SYSTEMS:   Constitutional: Denies fevers, chills or abnormal weight loss Eyes: Denies blurriness of vision Ears, nose, mouth, throat, and face: Denies mucositis or sore throat Respiratory: Denies cough, dyspnea or wheezes Cardiovascular: Denies palpitation, chest discomfort or lower extremity swelling Gastrointestinal:  Denies nausea, heartburn or change in bowel habits Skin: Denies abnormal skin rashes Lymphatics: Denies new lymphadenopathy or easy bruising Neurological:Denies numbness, tingling or new weaknesses Behavioral/Psych: Mood is stable, no new changes  All other systems were reviewed with the patient and are negative.  PHYSICAL EXAMINATION: ECOG PERFORMANCE STATUS: 0 - Asymptomatic  Blood pressure 131/83, pulse 73, temperature 98.7 F (37.1 C), temperature source Oral, resp. rate 19, height 5\' 9"  (1.753 m), weight 282 lb (127.914 kg).  GENERAL:alert, no distress and comfortable; moderately obese.  SKIN: skin color, texture, turgor are normal, no rashes or significant lesions EYES: normal, Conjunctiva are pink and non-injected, sclera clear OROPHARYNX:no exudate,  no  erythema and lips, buccal mucosa, and tongue normal  NECK: supple, thyroid normal size, non-tender, without nodularity LYMPH:  no palpable lymphadenopathy in the cervical, axillary or supraclavicular LUNGS: clear to auscultation and percussion with normal breathing effort HEART: regular rate & rhythm and no murmurs and no lower extremity edema ABDOMEN:abdomen soft, non-tender and normal bowel sounds; large ventral hernia.   Musculoskeletal:no cyanosis of digits and no clubbing  NEURO: alert & oriented x 3 with fluent speech, no focal motor/sensory deficits   LABORATORY DATA: Results for orders placed in visit on 07/02/13 (from the past 48 hour(s))  CBC WITH DIFFERENTIAL     Status: None   Collection Time    07/02/13  1:03 PM      Result Value Range   WBC 7.0  3.9 - 10.3 10e3/uL   NEUT# 4.4  1.5 - 6.5 10e3/uL   HGB 13.2  11.6 - 15.9 g/dL   HCT 38.8  34.8 - 46.6 %   Platelets 271  145 - 400 10e3/uL   MCV 99.8  79.5 - 101.0 fL   MCH 33.9  25.1 - 34.0 pg   MCHC 34.0  31.5 - 36.0 g/dL   RBC 3.89  3.70 - 5.45 10e6/uL   RDW 12.4  11.2 - 14.5 %   lymph# 1.8  0.9 - 3.3 10e3/uL   MONO# 0.6  0.1 - 0.9 10e3/uL   Eosinophils Absolute 0.2  0.0 - 0.5 10e3/uL   Basophils Absolute 0.0  0.0 - 0.1 10e3/uL   NEUT% 62.9  38.4 - 76.8 %   LYMPH% 25.7  14.0 - 49.7 %   MONO% 8.1  0.0 - 14.0 %   EOS% 2.8  0.0 - 7.0 %   BASO% 0.5  0.0 - 2.0 %  IRON AND TIBC [facility]     Status: None   Collection Time    07/02/13  1:03 PM      Result Value Range   Iron 110  41 - 142 ug/dL   TIBC 264  236 - 444 ug/dL   UIBC 154  120 - 384 ug/dL   %SAT 42  21 - 57 %  FERRITIN [facility]     Status: None   Collection Time    07/02/13  1:03 PM      Result Value Range   Ferritin 157  9 - 269 ng/ml  VITAMIN B12     Status: None   Collection Time    07/02/13  1:03 PM      Result Value Range   Vitamin B-12 800  211 - 911 pg/mL  LACTATE DEHYDROGENASE (CC13)     Status: None   Collection Time    07/02/13  1:03 PM       Result Value Range   LDH 169  125 - 245 U/L  COMPREHENSIVE METABOLIC PANEL (0000000)     Status: Abnormal   Collection Time    07/02/13  1:03 PM      Result Value Range   Sodium 143  136 - 145 mEq/L   Potassium 4.5  3.5 - 5.1 mEq/L   Chloride 108  98 - 109 mEq/L   CO2 24  22 - 29 mEq/L   Glucose 112  70 - 140 mg/dl   BUN 27.7 (*) 7.0 - 26.0 mg/dL   Creatinine 1.6 (*) 0.6 - 1.1 mg/dL   Total Bilirubin 0.30  0.20 - 1.20 mg/dL   Alkaline Phosphatase 70  40 - 150 U/L   AST 14  5 - 34 U/L   ALT 13  0 - 55 U/L   Total Protein 7.3  6.4 - 8.3 g/dL   Albumin 3.7  3.5 - 5.0 g/dL   Calcium 9.9  8.4 - 10.4 mg/dL    Labs:  Lab Results  Component Value Date   WBC 7.0 07/02/2013   HGB 13.2 07/02/2013   HCT 38.8 07/02/2013   MCV 99.8 07/02/2013   PLT 271 07/02/2013   NEUTROABS 4.4 07/02/2013      Chemistry      Component Value Date/Time   NA 143 07/02/2013 1303   NA 139 12/17/2011 1453   K 4.5 07/02/2013 1303   K 4.2 12/17/2011 1453   CL 108* 12/22/2012 1524   CL 101 12/17/2011 1453   CO2 24 07/02/2013 1303   CO2 27 12/17/2011 1453   BUN 27.7* 07/02/2013 1303   BUN 31* 12/17/2011 1453   CREATININE 1.6* 07/02/2013 1303   CREATININE 1.86* 12/17/2011 1453      Component Value Date/Time   CALCIUM 9.9 07/02/2013 1303   CALCIUM 10.1 12/17/2011 1453   ALKPHOS 70 07/02/2013 1303   ALKPHOS 78 12/17/2011 1453   AST 14 07/02/2013 1303   AST 21 12/17/2011 1453   ALT 13 07/02/2013 1303   ALT 17 12/17/2011 1453   BILITOT 0.30 07/02/2013 1303   BILITOT 0.3 12/17/2011 1453     Basic Metabolic Panel:  Recent Labs Lab 07/02/13 1303  NA 143  K 4.5  CO2 24  GLUCOSE 112  BUN 27.7*  CREATININE 1.6*  CALCIUM 9.9   GFR Estimated Creatinine Clearance: 50.3 ml/min (by C-G formula based on Cr of 1.6). Liver Function Tests:  Recent Labs Lab 07/02/13 1303  AST 14  ALT 13  ALKPHOS 70  BILITOT 0.30  PROT 7.3  ALBUMIN 3.7   RADIOGRAPHIC STUDIES: No results found.  ASSESSMENT: [name] 67 y.o.  female with a history of ANEMIA, IRON DEFICIENCY - Plan: CBC with Differential, CBC with Differential, Ferritin, Iron and TIBC, Comprehensive metabolic panel, Ferritin, Iron and TIBC, CANCELED: Ferritin, CANCELED: Iron and TIBC  HEMORRHOIDS, INTERNAL - Plan: CBC with Differential, CBC with Differential, Ferritin, Iron and TIBC, Comprehensive metabolic panel, CANCELED: Ferritin, CANCELED: Iron and TIBC  HEMORRHOIDS, EXTERNAL - Plan: CBC with Differential, CBC with Differential, Ferritin, Iron and TIBC, Comprehensive metabolic panel, Ferritin, Iron and TIBC, CANCELED: Ferritin, CANCELED: Iron and TIBC   PLAN:  1. Iron deficiency anemia associated with pagophagia felt to be most likely due to severe bleeding from external hemorrhoids.  She continues to be doing well.  She no longer has hemorrhoidal bleeding.  She remains on her ferrous sulfate.  We will continue to follow her CBC and ferritin in 3 months and see her back in 6 months at which time we will check CBC, chemistries, iron studies.   All questions were answered. The patient knows to call the clinic with any problems, questions or concerns. We can certainly see the patient much sooner if necessary.  I spent 10 minutes counseling the patient face to face. The total time spent in the appointment was 15 minutes.    Asher Torpey, MD 07/03/2013 9:52 PM

## 2013-07-09 ENCOUNTER — Telehealth: Payer: Self-pay | Admitting: Internal Medicine

## 2013-07-09 NOTE — Telephone Encounter (Signed)
returned pt call and lvm for pt regarding to moving appt to early morning per pt request.

## 2013-07-10 ENCOUNTER — Telehealth: Payer: Self-pay | Admitting: *Deleted

## 2013-07-10 NOTE — Telephone Encounter (Signed)
Message copied by Lance Morin on Tue Jul 10, 2013  4:55 PM ------      Message from: Lance Morin      Created: Tue Mar 27, 2013  4:51 PM       See if she wants to schedule pill cam after seeing hem/onc ------

## 2013-09-19 NOTE — Telephone Encounter (Signed)
Mailed pt a letter requesting she call if she wants to schedule a pill cam.

## 2013-10-02 ENCOUNTER — Other Ambulatory Visit (HOSPITAL_BASED_OUTPATIENT_CLINIC_OR_DEPARTMENT_OTHER): Payer: BC Managed Care – PPO

## 2013-10-02 DIAGNOSIS — K644 Residual hemorrhoidal skin tags: Secondary | ICD-10-CM

## 2013-10-02 DIAGNOSIS — D509 Iron deficiency anemia, unspecified: Secondary | ICD-10-CM

## 2013-10-02 LAB — IRON AND TIBC CHCC
%SAT: 12 % — ABNORMAL LOW (ref 21–57)
Iron: 32 ug/dL — ABNORMAL LOW (ref 41–142)
TIBC: 268 ug/dL (ref 236–444)
UIBC: 236 ug/dL (ref 120–384)

## 2013-10-02 LAB — FERRITIN CHCC: FERRITIN: 145 ng/mL (ref 9–269)

## 2013-12-31 ENCOUNTER — Ambulatory Visit (HOSPITAL_BASED_OUTPATIENT_CLINIC_OR_DEPARTMENT_OTHER): Payer: BC Managed Care – PPO | Admitting: Internal Medicine

## 2013-12-31 ENCOUNTER — Other Ambulatory Visit: Payer: Medicare Other | Admitting: Lab

## 2013-12-31 ENCOUNTER — Ambulatory Visit: Payer: Medicare Other

## 2013-12-31 ENCOUNTER — Other Ambulatory Visit (HOSPITAL_BASED_OUTPATIENT_CLINIC_OR_DEPARTMENT_OTHER): Payer: BC Managed Care – PPO

## 2013-12-31 ENCOUNTER — Telehealth: Payer: Self-pay | Admitting: Internal Medicine

## 2013-12-31 VITALS — BP 151/83 | HR 60 | Temp 97.2°F | Resp 20 | Ht 69.0 in | Wt 281.9 lb

## 2013-12-31 DIAGNOSIS — K644 Residual hemorrhoidal skin tags: Secondary | ICD-10-CM

## 2013-12-31 DIAGNOSIS — K648 Other hemorrhoids: Secondary | ICD-10-CM

## 2013-12-31 DIAGNOSIS — N189 Chronic kidney disease, unspecified: Secondary | ICD-10-CM

## 2013-12-31 DIAGNOSIS — N039 Chronic nephritic syndrome with unspecified morphologic changes: Secondary | ICD-10-CM

## 2013-12-31 DIAGNOSIS — N6009 Solitary cyst of unspecified breast: Secondary | ICD-10-CM

## 2013-12-31 DIAGNOSIS — D631 Anemia in chronic kidney disease: Secondary | ICD-10-CM

## 2013-12-31 DIAGNOSIS — D509 Iron deficiency anemia, unspecified: Secondary | ICD-10-CM

## 2013-12-31 DIAGNOSIS — N6002 Solitary cyst of left breast: Secondary | ICD-10-CM

## 2013-12-31 LAB — CBC WITH DIFFERENTIAL/PLATELET
BASO%: 0.5 % (ref 0.0–2.0)
Basophils Absolute: 0 10*3/uL (ref 0.0–0.1)
EOS%: 3.3 % (ref 0.0–7.0)
Eosinophils Absolute: 0.3 10*3/uL (ref 0.0–0.5)
HEMATOCRIT: 38.1 % (ref 34.8–46.6)
HGB: 13 g/dL (ref 11.6–15.9)
LYMPH%: 22.5 % (ref 14.0–49.7)
MCH: 34.6 pg — AB (ref 25.1–34.0)
MCHC: 34.1 g/dL (ref 31.5–36.0)
MCV: 101.6 fL — AB (ref 79.5–101.0)
MONO#: 0.7 10*3/uL (ref 0.1–0.9)
MONO%: 8.6 % (ref 0.0–14.0)
NEUT#: 5 10*3/uL (ref 1.5–6.5)
NEUT%: 65.1 % (ref 38.4–76.8)
PLATELETS: 321 10*3/uL (ref 145–400)
RBC: 3.74 10*6/uL (ref 3.70–5.45)
RDW: 12.8 % (ref 11.2–14.5)
WBC: 7.6 10*3/uL (ref 3.9–10.3)
lymph#: 1.7 10*3/uL (ref 0.9–3.3)

## 2013-12-31 LAB — COMPREHENSIVE METABOLIC PANEL (CC13)
ALBUMIN: 3.5 g/dL (ref 3.5–5.0)
ALT: 12 U/L (ref 0–55)
AST: 12 U/L (ref 5–34)
Alkaline Phosphatase: 70 U/L (ref 40–150)
Anion Gap: 14 mEq/L — ABNORMAL HIGH (ref 3–11)
BUN: 29.2 mg/dL — ABNORMAL HIGH (ref 7.0–26.0)
CHLORIDE: 109 meq/L (ref 98–109)
CO2: 22 mEq/L (ref 22–29)
Calcium: 10 mg/dL (ref 8.4–10.4)
Creatinine: 1.8 mg/dL — ABNORMAL HIGH (ref 0.6–1.1)
Glucose: 134 mg/dl (ref 70–140)
POTASSIUM: 4.9 meq/L (ref 3.5–5.1)
Sodium: 145 mEq/L (ref 136–145)
Total Bilirubin: 0.25 mg/dL (ref 0.20–1.20)
Total Protein: 7 g/dL (ref 6.4–8.3)

## 2013-12-31 LAB — IRON AND TIBC CHCC
%SAT: 58 % — ABNORMAL HIGH (ref 21–57)
Iron: 142 ug/dL (ref 41–142)
TIBC: 245 ug/dL (ref 236–444)
UIBC: 103 ug/dL — AB (ref 120–384)

## 2013-12-31 LAB — FERRITIN CHCC: Ferritin: 206 ng/ml (ref 9–269)

## 2013-12-31 NOTE — Telephone Encounter (Signed)
gv adn printed appt sched and avs for pt for July and OCT

## 2014-01-01 DIAGNOSIS — N039 Chronic nephritic syndrome with unspecified morphologic changes: Secondary | ICD-10-CM

## 2014-01-01 DIAGNOSIS — N189 Chronic kidney disease, unspecified: Secondary | ICD-10-CM | POA: Insufficient documentation

## 2014-01-01 DIAGNOSIS — D631 Anemia in chronic kidney disease: Secondary | ICD-10-CM | POA: Insufficient documentation

## 2014-01-01 DIAGNOSIS — N6002 Solitary cyst of left breast: Secondary | ICD-10-CM | POA: Insufficient documentation

## 2014-01-01 NOTE — Progress Notes (Signed)
[facility] OFFICE PROGRESS NOTE  Tara Bolt, MD [address] [place] [address]  DIAGNOSIS: ANEMIA, IRON DEFICIENCY - Plan: CBC with Differential, Ferritin, Iron and TIBC, CBC with Differential, Comprehensive metabolic panel (Cmet) - [facility], Ferritin, Iron and TIBC  HEMORRHOIDS, EXTERNAL  Cyst of left breast - Plan: US BREAST COMPLETE UNI LEFT INC AXILLA  Anemia in chronic kidney disease(285.21)  CKD (chronic kidney disease)  Chief Complaint  Patient presents with  . ANEMIA, IRON DEFICIENCY    CURRENT THERAPY: Ferrous sulfate once daily.   INTERVAL HISTORY: [name] 68 y.o. female with a history of iron-  deficiency anemia which in the past was due to hemorrhoidal bleeding presents for follow-up.  She was last seen by me on 07/02/2013.  She had a mammogram in June which reportedly showed cyst in her left breast. In August, she had a digital diagnostic mammogram which showed this to be a likely benign complex cyst in the upper outer periareolar left breast corresponding to the area of concern on screening  mammography. She was recommended to have an ultrasound of her left breast in 6 months for follow up.  She states that she has not had it yet.  She denies picca presently.  She also denies hematochezia or black tarry stools.  She denies hematuria. She has a follow up with her PCP Dr. Wilson Saunders tomorrow.   MEDICAL HISTORY: Past Medical History  Diagnosis Date  . Pagophagia   . Anemia, iron deficiency   . Hemorrhoids, external   . H/O: GI bleed 09/2008  . Vitamin B12 deficiency   . Renal insufficiency   . Sleep apnea, obstructive     Does not use oxygen at bedtime   . DM (diabetes mellitus)   . Hypothyroidism   . Obesity   . Depression   . HTN (hypertension)   . OA (osteoarthritis)   . Carpal tunnel syndrome   . Gastritis     INTERIM HISTORY: has HYPOTHYROIDISM; DIABETES MELLITUS; VITAMIN B12 DEFICIENCY; HYPERLIPIDEMIA;  ANEMIA, IRON DEFICIENCY; ANXIETY; DEPRESSION; CARPAL TUNNEL SYNDROME, BILATERAL; HYPERTENSION; HEMORRHOIDS, INTERNAL; HEMORRHOIDS, EXTERNAL; ASTHMA; RECTAL BLEEDING; GASTROINTESTINAL HEMORRHAGE; OSTEOARTHRITIS; SLEEP APNEA; Cyst of left breast; Anemia in chronic kidney disease(285.21); and CKD (chronic kidney disease) on her problem list.    ALLERGIES:  is allergic to other.  MEDICATIONS: has a current medication list which includes the following prescription(s): acetaminophen, calcium carbonate-vitamin d, diphenhydramine, docusate sodium, ferrous sulfate, furosemide, levothyroxine, losartan, melatonin, metformin, sitagliptin, tolterodine, tramadol, and vitamin b-12.  SURGICAL HISTORY:  Past Surgical History  Procedure Laterality Date  . Appendectomy    . Abdominal hysterectomy    . Hemorrhoid surgery    . Tonsillectomy    . Adenoidectomy      REVIEW OF SYSTEMS:   Constitutional: Denies fevers, chills or abnormal weight loss Eyes: Denies blurriness of vision Ears, nose, mouth, throat, and face: Denies mucositis or sore throat Respiratory: Denies cough, dyspnea or wheezes Cardiovascular: Denies palpitation, chest discomfort or lower extremity swelling Gastrointestinal:  Denies nausea, heartburn or change in bowel habits Skin: Denies abnormal skin rashes Lymphatics: Denies new lymphadenopathy or easy bruising Neurological:Denies numbness, tingling or new weaknesses Behavioral/Psych: Mood is stable, no new changes  All other systems were reviewed with the patient and are negative.  PHYSICAL EXAMINATION: ECOG PERFORMANCE STATUS: 0 - Asymptomatic  Blood pressure 151/83, pulse 60, temperature 97.2 F (36.2 C), temperature source Oral, resp. rate 20, height 5\' 9"  (1.753 m), weight 281 lb 14.2 oz [id number]  kg), SpO2 99.00%.  GENERAL:alert, no distress and comfortable; moderately obese.  SKIN: skin color, texture, turgor are normal, no rashes or significant lesions EYES: normal,  Conjunctiva are pink and non-injected, sclera clear OROPHARYNX:no exudate, no erythema and lips, buccal mucosa, and tongue normal  NECK: supple, thyroid normal size, non-tender, without nodularity LYMPH:  no palpable lymphadenopathy in the cervical, axillary or supraclavicular LUNGS: clear to auscultation and percussion with normal breathing effort HEART: regular rate & rhythm and no murmurs and no lower extremity edema ABDOMEN:abdomen soft, non-tender and normal bowel sounds; large ventral hernia.   Musculoskeletal:no cyanosis of digits and no clubbing  NEURO: alert & oriented x 3 with fluent speech, no focal motor/sensory deficits  LABORATORY DATA: Results for orders placed in visit on 12/31/13 (from the past 48 hour(s))  CBC WITH DIFFERENTIAL     Status: Abnormal   Collection Time    12/31/13  8:59 AM      Result Value Ref Range   WBC 7.6  3.9 - 10.3 10e3/uL   NEUT# 5.0  1.5 - 6.5 10e3/uL   HGB 13.0  11.6 - 15.9 g/dL   HCT 38.1  34.8 - 46.6 %   Platelets 321  145 - 400 10e3/uL   MCV 101.6 (*) 79.5 - 101.0 fL   MCH 34.6 (*) 25.1 - 34.0 pg   MCHC 34.1  31.5 - 36.0 g/dL   RBC 3.74  3.70 - 5.45 10e6/uL   RDW 12.8  11.2 - 14.5 %   lymph# 1.7  0.9 - 3.3 10e3/uL   MONO# 0.7  0.1 - 0.9 10e3/uL   Eosinophils Absolute 0.3  0.0 - 0.5 10e3/uL   Basophils Absolute 0.0  0.0 - 0.1 10e3/uL   NEUT% 65.1  38.4 - 76.8 %   LYMPH% 22.5  14.0 - 49.7 %   MONO% 8.6  0.0 - 14.0 %   EOS% 3.3  0.0 - 7.0 %   BASO% 0.5  0.0 - 2.0 %  FERRITIN [facility]     Status: None   Collection Time    12/31/13  8:59 AM      Result Value Ref Range   Ferritin 206  9 - 269 ng/ml  IRON AND TIBC [facility]     Status: Abnormal   Collection Time    12/31/13  8:59 AM      Result Value Ref Range   Iron 142  41 - 142 ug/dL   TIBC 245  236 - 444 ug/dL   UIBC 103 (*) 120 - 384 ug/dL   %SAT 58 (*) 21 - 57 %  COMPREHENSIVE METABOLIC PANEL (0000000)     Status: Abnormal   Collection Time    12/31/13  8:59 AM      Result Value Ref  Range   Sodium 145  136 - 145 mEq/L   Potassium 4.9  3.5 - 5.1 mEq/L   Chloride 109  98 - 109 mEq/L   CO2 22  22 - 29 mEq/L   Glucose 134  70 - 140 mg/dl   BUN 29.2 (*) 7.0 - 26.0 mg/dL   Creatinine 1.8 (*) 0.6 - 1.1 mg/dL   Total Bilirubin 0.25  0.20 - 1.20 mg/dL   Alkaline Phosphatase 70  40 - 150 U/L   AST 12  5 - 34 U/L   ALT 12  0 - 55 U/L   Total Protein 7.0  6.4 - 8.3 g/dL   Albumin 3.5  3.5 - 5.0 g/dL   Calcium  10.0  8.4 - 10.4 mg/dL   Anion Gap 14 (*) 3 - 11 mEq/L    Labs:  Lab Results  Component Value Date   WBC 7.6 12/31/2013   HGB 13.0 12/31/2013   HCT 38.1 12/31/2013   MCV 101.6* 12/31/2013   PLT 321 12/31/2013   NEUTROABS 5.0 12/31/2013      Chemistry      Component Value Date/Time   NA 145 12/31/2013 0859   NA 139 12/17/2011 1453   K 4.9 12/31/2013 0859   K 4.2 12/17/2011 1453   CL 108* 12/22/2012 1524   CL 101 12/17/2011 1453   CO2 22 12/31/2013 0859   CO2 27 12/17/2011 1453   BUN 29.2* 12/31/2013 0859   BUN 31* 12/17/2011 1453   CREATININE 1.8* 12/31/2013 0859   CREATININE 1.86* 12/17/2011 1453      Component Value Date/Time   CALCIUM 10.0 12/31/2013 0859   CALCIUM 10.1 12/17/2011 1453   ALKPHOS 70 12/31/2013 0859   ALKPHOS 78 12/17/2011 1453   AST 12 12/31/2013 0859   AST 21 12/17/2011 1453   ALT 12 12/31/2013 0859   ALT 17 12/17/2011 1453   BILITOT 0.25 12/31/2013 0859   BILITOT 0.3 12/17/2011 1453     Basic Metabolic Panel:  Recent Labs Lab 12/31/13 0859  NA 145  K 4.9  CO2 22  GLUCOSE 134  BUN 29.2*  CREATININE 1.8*  CALCIUM 10.0   GFR Estimated Creatinine Clearance: 44.1 ml/min (by C-G formula based on Cr of 1.8). Liver Function Tests:  Recent Labs Lab 12/31/13 0859  AST 12  ALT 12  ALKPHOS 70  BILITOT 0.25  PROT 7.0  ALBUMIN 3.5   RADIOGRAPHIC STUDIES: No results found.  ASSESSMENT: [name] 68 y.o. female with a history of ANEMIA, IRON DEFICIENCY - Plan: CBC with Differential, Ferritin, Iron and TIBC, CBC with Differential, Comprehensive  metabolic panel (Cmet) - [facility], Ferritin, Iron and TIBC  HEMORRHOIDS, EXTERNAL  Cyst of left breast - Plan: US BREAST COMPLETE UNI LEFT INC AXILLA  Anemia in chronic kidney disease(285.21)  CKD (chronic kidney disease)   PLAN:  1. History of Iron deficiency anemia.  --Previously this was associated with pagophagia felt to be most likely due to severe bleeding from external hemorrhoids.  She continues to be doing well.  She no longer has hemorrhoidal bleeding.  She remains on her ferrous sulfate.   Her iron indices are within normal limits.   -- We will continue to follow her CBC and ferritin in 3 months and see her back in 6 months at which time we will check CBC, chemistries, iron studies.    2. Left breast cyst.  --Had diagnostic mammogram in August with recommendations for follow-up ultrasound.  We will order this today.    All questions were answered. The patient knows to call the clinic with any problems, questions or concerns. We can certainly see the patient much sooner if necessary.  I spent 15 minutes counseling the patient face to face. The total time spent in the appointment was 25 minutes.    Arvo Ealy, MD 01/01/2014 10:26 PM

## 2014-01-02 ENCOUNTER — Other Ambulatory Visit: Payer: Self-pay | Admitting: Internal Medicine

## 2014-03-13 ENCOUNTER — Encounter (INDEPENDENT_AMBULATORY_CARE_PROVIDER_SITE_OTHER): Payer: Self-pay

## 2014-03-13 ENCOUNTER — Ambulatory Visit
Admission: RE | Admit: 2014-03-13 | Discharge: 2014-03-13 | Disposition: A | Discharge status: Home / Self Care | Payer: BC Managed Care – PPO | Source: Ambulatory Visit | Attending: Internal Medicine | Admitting: Internal Medicine

## 2014-03-13 DIAGNOSIS — N6002 Solitary cyst of left breast: Secondary | ICD-10-CM

## 2014-04-01 ENCOUNTER — Other Ambulatory Visit (HOSPITAL_BASED_OUTPATIENT_CLINIC_OR_DEPARTMENT_OTHER): Payer: BC Managed Care – PPO

## 2014-04-01 DIAGNOSIS — D509 Iron deficiency anemia, unspecified: Secondary | ICD-10-CM

## 2014-04-01 LAB — CBC WITH DIFFERENTIAL/PLATELET
BASO%: 0.5 % (ref 0.0–2.0)
Basophils Absolute: 0 10*3/uL (ref 0.0–0.1)
EOS%: 5.6 % (ref 0.0–7.0)
Eosinophils Absolute: 0.5 10*3/uL (ref 0.0–0.5)
HCT: 38.3 % (ref 34.8–46.6)
HGB: 12.9 g/dL (ref 11.6–15.9)
LYMPH%: 17.8 % (ref 14.0–49.7)
MCH: 34.1 pg — ABNORMAL HIGH (ref 25.1–34.0)
MCHC: 33.6 g/dL (ref 31.5–36.0)
MCV: 101.6 fL — AB (ref 79.5–101.0)
MONO#: 0.8 10*3/uL (ref 0.1–0.9)
MONO%: 9.2 % (ref 0.0–14.0)
NEUT%: 66.9 % (ref 38.4–76.8)
NEUTROS ABS: 5.9 10*3/uL (ref 1.5–6.5)
Platelets: 247 10*3/uL (ref 145–400)
RBC: 3.77 10*6/uL (ref 3.70–5.45)
RDW: 12.3 % (ref 11.2–14.5)
WBC: 8.8 10*3/uL (ref 3.9–10.3)
lymph#: 1.6 10*3/uL (ref 0.9–3.3)

## 2014-04-01 LAB — IRON AND TIBC CHCC
%SAT: 35 % (ref 21–57)
IRON: 83 ug/dL (ref 41–142)
TIBC: 235 ug/dL — ABNORMAL LOW (ref 236–444)
UIBC: 152 ug/dL (ref 120–384)

## 2014-04-01 LAB — FERRITIN CHCC: FERRITIN: 140 ng/mL (ref 9–269)

## 2014-04-10 ENCOUNTER — Other Ambulatory Visit: Payer: Self-pay

## 2014-04-10 DIAGNOSIS — Z1231 Encounter for screening mammogram for malignant neoplasm of breast: Secondary | ICD-10-CM

## 2014-04-18 ENCOUNTER — Ambulatory Visit
Admission: RE | Admit: 2014-04-18 | Discharge: 2014-04-18 | Disposition: A | Discharge status: Home / Self Care | Payer: BC Managed Care – PPO | Source: Ambulatory Visit

## 2014-04-18 ENCOUNTER — Encounter (INDEPENDENT_AMBULATORY_CARE_PROVIDER_SITE_OTHER): Payer: Self-pay

## 2014-04-18 DIAGNOSIS — Z1231 Encounter for screening mammogram for malignant neoplasm of breast: Secondary | ICD-10-CM

## 2014-06-17 ENCOUNTER — Telehealth: Payer: Self-pay | Admitting: Hematology

## 2014-06-17 NOTE — Telephone Encounter (Signed)
returned pt call and s.w pt to r/s appt per pt request..done....pt ok adn aware of new d.t

## 2014-06-27 ENCOUNTER — Telehealth: Payer: Self-pay | Admitting: Hematology

## 2014-06-27 NOTE — Telephone Encounter (Signed)
pt lmonvm to cx 11/25 appts and she would call back to r/s. lmonvm for pt cnfirming cancellation. pt to call back to r/s at her convenience.

## 2014-07-01 ENCOUNTER — Ambulatory Visit: Payer: Medicare Other

## 2014-07-01 ENCOUNTER — Other Ambulatory Visit: Payer: Medicare Other

## 2014-08-21 ENCOUNTER — Other Ambulatory Visit: Payer: BC Managed Care – PPO

## 2014-08-21 ENCOUNTER — Ambulatory Visit: Payer: Self-pay

## 2018-03-23 ENCOUNTER — Emergency Department (HOSPITAL_BASED_OUTPATIENT_CLINIC_OR_DEPARTMENT_OTHER)
Admission: EM | Admit: 2018-03-23 | Discharge: 2018-03-23 | Disposition: A | Discharge status: Home / Self Care | Payer: BC Managed Care – PPO | Attending: Emergency Medicine | Admitting: Emergency Medicine

## 2018-03-23 ENCOUNTER — Emergency Department (HOSPITAL_BASED_OUTPATIENT_CLINIC_OR_DEPARTMENT_OTHER): Payer: BC Managed Care – PPO

## 2018-03-23 ENCOUNTER — Other Ambulatory Visit: Payer: Self-pay

## 2018-03-23 ENCOUNTER — Encounter (HOSPITAL_BASED_OUTPATIENT_CLINIC_OR_DEPARTMENT_OTHER): Payer: Self-pay | Admitting: Emergency Medicine

## 2018-03-23 DIAGNOSIS — X500XXA Overexertion from strenuous movement or load, initial encounter: Secondary | ICD-10-CM | POA: Diagnosis not present

## 2018-03-23 DIAGNOSIS — N189 Chronic kidney disease, unspecified: Secondary | ICD-10-CM | POA: Diagnosis not present

## 2018-03-23 DIAGNOSIS — Y9319 Activity, other involving water and watercraft: Secondary | ICD-10-CM | POA: Insufficient documentation

## 2018-03-23 DIAGNOSIS — Y92814 Boat as the place of occurrence of the external cause: Secondary | ICD-10-CM | POA: Insufficient documentation

## 2018-03-23 DIAGNOSIS — E1122 Type 2 diabetes mellitus with diabetic chronic kidney disease: Secondary | ICD-10-CM | POA: Diagnosis not present

## 2018-03-23 DIAGNOSIS — E039 Hypothyroidism, unspecified: Secondary | ICD-10-CM | POA: Insufficient documentation

## 2018-03-23 DIAGNOSIS — Y999 Unspecified external cause status: Secondary | ICD-10-CM | POA: Insufficient documentation

## 2018-03-23 DIAGNOSIS — S39012A Strain of muscle, fascia and tendon of lower back, initial encounter: Secondary | ICD-10-CM | POA: Diagnosis not present

## 2018-03-23 DIAGNOSIS — Z7984 Long term (current) use of oral hypoglycemic drugs: Secondary | ICD-10-CM | POA: Diagnosis not present

## 2018-03-23 DIAGNOSIS — Z79899 Other long term (current) drug therapy: Secondary | ICD-10-CM | POA: Insufficient documentation

## 2018-03-23 DIAGNOSIS — S3992XA Unspecified injury of lower back, initial encounter: Secondary | ICD-10-CM | POA: Diagnosis present

## 2018-03-23 DIAGNOSIS — I129 Hypertensive chronic kidney disease with stage 1 through stage 4 chronic kidney disease, or unspecified chronic kidney disease: Secondary | ICD-10-CM | POA: Diagnosis not present

## 2018-03-23 LAB — URINALYSIS, MICROSCOPIC (REFLEX)

## 2018-03-23 LAB — URINALYSIS, ROUTINE W REFLEX MICROSCOPIC
Bilirubin Urine: NEGATIVE
GLUCOSE, UA: NEGATIVE mg/dL
Ketones, ur: NEGATIVE mg/dL
Leukocytes, UA: NEGATIVE
Nitrite: NEGATIVE
PROTEIN: 100 mg/dL — AB
SPECIFIC GRAVITY, URINE: 1.025 (ref 1.005–1.030)
pH: 6 (ref 5.0–8.0)

## 2018-03-23 MED ORDER — CYCLOBENZAPRINE HCL 10 MG PO TABS: 10.0000 mg | ORAL_TABLET | Freq: Two times a day (BID) | ORAL | 0 refills | Status: DC | PRN

## 2018-03-23 MED ORDER — LIDOCAINE 5 % EX PTCH: 1.0000 | MEDICATED_PATCH | CUTANEOUS | 0 refills | Status: DC

## 2018-03-23 MED ORDER — CYCLOBENZAPRINE HCL 10 MG PO TABS
10.0000 mg | ORAL_TABLET | Freq: Once | ORAL | Status: AC
  Administered 2018-03-23: 10 mg via ORAL
  Filled 2018-03-23: qty 1

## 2018-03-23 MED FILL — CYCLOBENZAPRINE HCL 10 MG T: 10 | 10 days supply | Qty: 20 | Fill #0
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
## 2018-03-23 NOTE — ED Triage Notes (Signed)
Reports mid back pain since Friday after riding on boat.  Denies dysuria.

## 2018-03-23 NOTE — Discharge Instructions (Signed)
Your back pain is likely due to muscle strain.  Please take muscle relaxant and your pain medication as needed.  You may apply lidoderm patch to affected area for comfort.  Follow up with your doctor for recheck of your blood pressure as it is high today.

## 2018-12-26 ENCOUNTER — Other Ambulatory Visit: Payer: Self-pay | Admitting: Internal Medicine

## 2018-12-26 DIAGNOSIS — Z1231 Encounter for screening mammogram for malignant neoplasm of breast: Secondary | ICD-10-CM

## 2018-12-28 ENCOUNTER — Other Ambulatory Visit: Payer: Self-pay | Admitting: Pain Medicine

## 2018-12-28 ENCOUNTER — Other Ambulatory Visit: Payer: Self-pay

## 2018-12-28 ENCOUNTER — Ambulatory Visit
Admission: RE | Admit: 2018-12-28 | Discharge: 2018-12-28 | Disposition: A | Discharge status: Home / Self Care | Payer: BC Managed Care – PPO | Source: Ambulatory Visit | Attending: Pain Medicine | Admitting: Pain Medicine

## 2018-12-28 DIAGNOSIS — M25561 Pain in right knee: Secondary | ICD-10-CM

## 2018-12-28 DIAGNOSIS — M25562 Pain in left knee: Principal | ICD-10-CM

## 2019-02-23 ENCOUNTER — Ambulatory Visit: Payer: BC Managed Care – PPO

## 2019-03-10 ENCOUNTER — Ambulatory Visit
Admission: RE | Admit: 2019-03-10 | Discharge: 2019-03-10 | Disposition: A | Discharge status: Home / Self Care | Payer: BC Managed Care – PPO | Source: Ambulatory Visit | Attending: Internal Medicine | Admitting: Internal Medicine

## 2019-03-10 ENCOUNTER — Other Ambulatory Visit: Payer: Self-pay

## 2019-03-10 DIAGNOSIS — Z1231 Encounter for screening mammogram for malignant neoplasm of breast: Secondary | ICD-10-CM

## 2019-10-21 DIAGNOSIS — U071 COVID-19: Secondary | ICD-10-CM | POA: Insufficient documentation

## 2020-03-04 ENCOUNTER — Other Ambulatory Visit: Payer: Self-pay | Admitting: Internal Medicine

## 2020-03-04 DIAGNOSIS — Z1231 Encounter for screening mammogram for malignant neoplasm of breast: Secondary | ICD-10-CM

## 2020-03-12 ENCOUNTER — Other Ambulatory Visit: Payer: Self-pay

## 2020-03-12 ENCOUNTER — Ambulatory Visit
Admission: RE | Admit: 2020-03-12 | Discharge: 2020-03-12 | Disposition: A | Discharge status: Home / Self Care | Payer: Medicare Other | Source: Ambulatory Visit | Attending: Internal Medicine | Admitting: Internal Medicine

## 2020-03-12 DIAGNOSIS — Z1231 Encounter for screening mammogram for malignant neoplasm of breast: Secondary | ICD-10-CM

## 2020-03-27 DIAGNOSIS — C801 Malignant (primary) neoplasm, unspecified: Secondary | ICD-10-CM

## 2020-03-27 HISTORY — DX: Malignant (primary) neoplasm, unspecified: C80.1

## 2020-06-25 ENCOUNTER — Other Ambulatory Visit: Payer: Self-pay | Admitting: *Deleted

## 2020-06-25 DIAGNOSIS — N189 Chronic kidney disease, unspecified: Secondary | ICD-10-CM

## 2020-07-14 ENCOUNTER — Other Ambulatory Visit (HOSPITAL_COMMUNITY): Payer: Medicare Other

## 2020-07-14 ENCOUNTER — Encounter (HOSPITAL_COMMUNITY): Payer: Medicare Other

## 2020-07-14 ENCOUNTER — Encounter: Payer: Medicare Other | Admitting: Surgery

## 2020-07-22 ENCOUNTER — Other Ambulatory Visit: Payer: Self-pay | Admitting: *Deleted

## 2020-07-22 DIAGNOSIS — N189 Chronic kidney disease, unspecified: Secondary | ICD-10-CM

## 2020-08-05 ENCOUNTER — Other Ambulatory Visit: Payer: Self-pay

## 2020-08-05 ENCOUNTER — Ambulatory Visit (INDEPENDENT_AMBULATORY_CARE_PROVIDER_SITE_OTHER): Payer: Medicare Other | Admitting: Vascular Surgery

## 2020-08-05 ENCOUNTER — Ambulatory Visit (INDEPENDENT_AMBULATORY_CARE_PROVIDER_SITE_OTHER)
Admission: RE | Admit: 2020-08-05 | Discharge: 2020-08-05 | Disposition: A | Discharge status: Home / Self Care | Payer: Medicare Other | Source: Ambulatory Visit | Attending: Vascular Surgery | Admitting: Vascular Surgery

## 2020-08-05 ENCOUNTER — Encounter: Payer: Self-pay | Admitting: Vascular Surgery

## 2020-08-05 ENCOUNTER — Ambulatory Visit (HOSPITAL_COMMUNITY)
Admission: RE | Admit: 2020-08-05 | Discharge: 2020-08-05 | Disposition: A | Discharge status: Home / Self Care | Payer: Medicare Other | Source: Ambulatory Visit | Attending: Vascular Surgery | Admitting: Vascular Surgery

## 2020-08-05 VITALS — BP 164/75 | HR 68 | Temp 97.7°F | Resp 20 | Ht 69.0 in | Wt 240.0 lb

## 2020-08-05 DIAGNOSIS — N185 Chronic kidney disease, stage 5: Secondary | ICD-10-CM | POA: Diagnosis not present

## 2020-08-05 DIAGNOSIS — N189 Chronic kidney disease, unspecified: Secondary | ICD-10-CM

## 2020-08-05 NOTE — H&P (View-Only) (Signed)
ASSESSMENT & PLAN:  74 y.o. female with CKDV nearing ESRD.  I spent greater than 15 minutes explaining hemodialysis, and the specifics of dialysis access.  I counseled her that all dialysis access requires maintenance.  I explained the rationale for using autogenous tissue when possible.  I counseled her about the risks of steal syndrome.  She understood and wished to proceed with HD access creation.  Plan left upper extremity brachiocephalic AVF creation 53/29/92. Because she does not yet need HD I will avoid placing a TDC.  CHIEF COMPLAINT:   CKD5  HISTORY:  HISTORY OF PRESENT ILLNESS: [name] is a 74 y.o. female who presents to clinic for evaluation of dialysis access.  She is a right-handed woman.  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  She is not sure exactly when she will need this but her impression is that this is several months away.  She is an incredibly pleasant woman who has multiple excellent questions about dialysis access today.  Past Medical History:  Diagnosis Date  . Anemia, iron deficiency   . Carpal tunnel syndrome   . Depression   . DM (diabetes mellitus) ([place])   . Gastritis   . H/O: GI bleed 09/2008  . Hemorrhoids, external   . HTN (hypertension)   . Hypothyroidism   . OA (osteoarthritis)   . Obesity   . Pagophagia   . Renal insufficiency   . Sleep apnea, obstructive    Does not use oxygen at bedtime   . Vitamin B12 deficiency     Past Surgical History:  Procedure Laterality Date  . ABDOMINAL HYSTERECTOMY    . ADENOIDECTOMY    . APPENDECTOMY    . HEMORRHOID SURGERY    . TONSILLECTOMY      Family History  Problem Relation Age of Onset  . Hypertension Son   . Hemochromatosis Son   . Fibromyalgia Daughter     Social History   Socioeconomic History  . Marital status: Divorced    Spouse name: Not on file  . Number of children: Not on file  . Years of education: Not on file  . Highest education level: Not on file    Occupational History  . Not on file  Tobacco Use  . Smoking status: Never Smoker  . Smokeless tobacco: Never Used  Substance and Sexual Activity  . Alcohol use: No  . Drug use: No  . Sexual activity: Not on file  Other Topics Concern  . Not on file  Social History Narrative  . Not on file   Social Determinants of Health   Financial Resource Strain:   . Difficulty of Paying Living Expenses: Not on file  Food Insecurity:   . Worried About Charity fundraiser in the Last Year: Not on file  . Ran Out of Food in the Last Year: Not on file  Transportation Needs:   . Lack of Transportation (Medical): Not on file  . Lack of Transportation (Non-Medical): Not on file  Physical Activity:   . Days of Exercise per Week: Not on file  . Minutes of Exercise per Session: Not on file  Stress:   . Feeling of Stress : Not on file  Social Connections:   . Frequency of Communication with Friends and Family: Not on file  . Frequency of Social Gatherings with Friends and Family: Not on file  . Attends Religious Services: Not on file  . Active Member of Clubs or  Organizations: Not on file  . Attends Archivist Meetings: Not on file  . Marital Status: Not on file  Intimate Partner Violence:   . Fear of Current or Ex-Partner: Not on file  . Emotionally Abused: Not on file  . Physically Abused: Not on file  . Sexually Abused: Not on file    Allergies  Allergen Reactions  . Other     Dust and feathers    Current Outpatient Medications  Medication Sig Dispense Refill  . amLODipine (NORVASC) 5 MG tablet Take 5 mg by mouth daily.    . cyclobenzaprine (FLEXERIL) 10 MG tablet Take 1 tablet (10 mg total) by mouth 2 (two) times daily as needed for muscle spasms. 20 tablet 0  . docusate sodium (COLACE) 100 MG capsule Take 100 mg by mouth 2 (two) times daily as needed.    Marland Kitchen levothyroxine (SYNTHROID, LEVOTHROID) 100 MCG tablet Take 100 mcg by mouth daily.    Marland Kitchen loratadine (CLARITIN) 10 MG  tablet Take 10 mg by mouth daily.    . ranitidine (ZANTAC) 150 MG tablet Take 150 mg by mouth 2 (two) times daily.    . vitamin B-12 (CYANOCOBALAMIN) 1000 MCG tablet Take 1,000 mcg by mouth every other day.     No current facility-administered medications for this visit.    REVIEW OF SYSTEMS:  [X]  denotes positive finding, [ ]  denotes negative finding Cardiac  Comments:  Chest pain or chest pressure:    Shortness of breath upon exertion:    Short of breath when lying flat:    Irregular heart rhythm:        Vascular    Pain in calf, thigh, or hip brought on by ambulation:    Pain in feet at night that wakes you up from your sleep:     Blood clot in your veins:    Leg swelling:  x       Pulmonary    Oxygen at home:    Productive cough:     Wheezing:         Neurologic    Sudden weakness in arms or legs:     Sudden numbness in arms or legs:     Sudden onset of difficulty speaking or slurred speech:    Temporary loss of vision in one eye:     Problems with dizziness:         Gastrointestinal    Blood in stool:     Vomited blood:         Genitourinary    Burning when urinating:     Blood in urine:        Psychiatric    Major depression:         Hematologic    Bleeding problems:    Problems with blood clotting too easily:        Skin    Rashes or ulcers:        Constitutional    Fever or chills:     PHYSICAL EXAM:   Vitals:   08/05/20 1433  BP: (!) 164/75  Pulse: 68  Resp: 20  Temp: 97.7 F (36.5 C)  SpO2: 97%  Weight: 240 lb (108.9 kg)  Height: 5\' 9"  (1.753 m)    Constitutional: Well appearing in no distress. Appears well nourished.  Neurologic: Normal gait and station. CN intact.  No weakness.  No sensory loss. Psychiatric: Mood and affect symmetric and appropriate. Eyes: No icterus.  No conjunctival pallor. Ears, nose, throat:  mucous membranes moist.  Midline trachea.  Cardiac: regular rate and rhythm.  Respiratory: Unlabored Abdominal: soft,  non-tender, non-distended.  Peripheral vascular: 2+ radial pulse on the left 2+ brachial pulse on the left Extremity: No edema.  No cyanosis.  No pallor.  Skin: No gangrene.  No ulceration.  Lymphatic: No Stemmer's sign.  No palpable lymphadenopathy.  DATA REVIEW:    Most recent CBC CBC Latest Ref Rng & Units 04/01/2014 12/31/2013 07/02/2013  WBC 3.9 - 10.3 10e3/uL 8.8 7.6 7.0  Hemoglobin 11.6 - 15.9 g/dL 12.9 13.0 13.2  Hematocrit 34 - 46 % 38.3 38.1 38.8  Platelets 145 - 400 10e3/uL 247 321 271     Most recent CMP CMP Latest Ref Rng & Units 12/31/2013 07/02/2013 12/22/2012  Glucose 70 - 140 mg/dl 134 112 120(H)  BUN 7.0 - 26.0 mg/dL 29.2(H) 27.7(H) 32.1(H)  Creatinine 0.6 - 1.1 mg/dL 1.8(H) 1.6(H) 1.7(H)  Sodium 136 - 145 mEq/L 145 143 146(H)  Potassium 3.5 - 5.1 mEq/L 4.9 4.5 4.4  Chloride 98 - 107 mEq/L - - 108(H)  CO2 22 - 29 mEq/L 22 24 25   Calcium 8.4 - 10.4 mg/dL 10.0 9.9 9.8  Total Protein 6.4 - 8.3 g/dL 7.0 7.3 6.9  Total Bilirubin 0.20 - 1.20 mg/dL 0.25 0.30 0.28  Alkaline Phos 40 - 150 U/L 70 70 86  AST 5 - 34 U/L 12 14 16   ALT 0 - 55 U/L 12 13 17       Yevonne Aline. Stanford Breed, MD [facility] Phone Number: [phone] 08/05/2020 2:29 PM

## 2020-08-05 NOTE — Progress Notes (Signed)
ASSESSMENT & PLAN:  74 y.o. female with CKDV nearing ESRD.  I spent greater than 15 minutes explaining hemodialysis, and the specifics of dialysis access.  I counseled her that all dialysis access requires maintenance.  I explained the rationale for using autogenous tissue when possible.  I counseled her about the risks of steal syndrome.  She understood and wished to proceed with HD access creation.  Plan left upper extremity brachiocephalic AVF creation 51/02/72. Because she does not yet need HD I will avoid placing a TDC.  CHIEF COMPLAINT:   CKD5  HISTORY:  HISTORY OF PRESENT ILLNESS: [name] is a 74 y.o. female who presents to clinic for evaluation of dialysis access.  She is a right-handed woman.  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  She is not sure exactly when she will need this but her impression is that this is several months away.  She is an incredibly pleasant woman who has multiple excellent questions about dialysis access today.  Past Medical History:  Diagnosis Date   Anemia, iron deficiency    Carpal tunnel syndrome    Depression    DM (diabetes mellitus) ([place])    Gastritis    H/O: GI bleed 09/2008   Hemorrhoids, external    HTN (hypertension)    Hypothyroidism    OA (osteoarthritis)    Obesity    Pagophagia    Renal insufficiency    Sleep apnea, obstructive    Does not use oxygen at bedtime    Vitamin B12 deficiency     Past Surgical History:  Procedure Laterality Date   ABDOMINAL HYSTERECTOMY     ADENOIDECTOMY     APPENDECTOMY     HEMORRHOID SURGERY     TONSILLECTOMY      Family History  Problem Relation Age of Onset   Hypertension Son    Hemochromatosis Son    Fibromyalgia Daughter     Social History   Socioeconomic History   Marital status: Divorced    Spouse name: Not on file   Number of children: Not on file   Years of education: Not on file   Highest education level: Not on file   Occupational History   Not on file  Tobacco Use   Smoking status: Never Smoker   Smokeless tobacco: Never Used  Substance and Sexual Activity   Alcohol use: No   Drug use: No   Sexual activity: Not on file  Other Topics Concern   Not on file  Social History Narrative   Not on file   Social Determinants of Health   Financial Resource Strain:    Difficulty of Paying Living Expenses: Not on file  Food Insecurity:    Worried About Running Out of Food in the Last Year: Not on file   [employer] of Food in the Last Year: Not on file  Transportation Needs:    Lack of Transportation (Medical): Not on file   Lack of Transportation (Non-Medical): Not on file  Physical Activity:    Days of Exercise per Week: Not on file   Minutes of Exercise per Session: Not on file  Stress:    Feeling of Stress : Not on file  Social Connections:    Frequency of Communication with Friends and Family: Not on file   Frequency of Social Gatherings with Friends and Family: Not on file   Attends Religious Services: Not on file   Active Member of Clubs or Organizations:  Not on file   Attends Club or Organization Meetings: Not on file   Marital Status: Not on file  Intimate Partner Violence:    Fear of Current or Ex-Partner: Not on file   Emotionally Abused: Not on file   Physically Abused: Not on file   Sexually Abused: Not on file    Allergies  Allergen Reactions   Other     Dust and feathers    Current Outpatient Medications  Medication Sig Dispense Refill   amLODipine (NORVASC) 5 MG tablet Take 5 mg by mouth daily.     cyclobenzaprine (FLEXERIL) 10 MG tablet Take 1 tablet (10 mg total) by mouth 2 (two) times daily as needed for muscle spasms. 20 tablet 0   docusate sodium (COLACE) 100 MG capsule Take 100 mg by mouth 2 (two) times daily as needed.     levothyroxine (SYNTHROID, LEVOTHROID) 100 MCG tablet Take 100 mcg by mouth daily.     loratadine (CLARITIN) 10 MG  tablet Take 10 mg by mouth daily.     ranitidine (ZANTAC) 150 MG tablet Take 150 mg by mouth 2 (two) times daily.     vitamin B-12 (CYANOCOBALAMIN) 1000 MCG tablet Take 1,000 mcg by mouth every other day.     No current facility-administered medications for this visit.    REVIEW OF SYSTEMS:  [X]  denotes positive finding, [ ]  denotes negative finding Cardiac  Comments:  Chest pain or chest pressure:    Shortness of breath upon exertion:    Short of breath when lying flat:    Irregular heart rhythm:        Vascular    Pain in calf, thigh, or hip brought on by ambulation:    Pain in feet at night that wakes you up from your sleep:     Blood clot in your veins:    Leg swelling:  x       Pulmonary    Oxygen at home:    Productive cough:     Wheezing:         Neurologic    Sudden weakness in arms or legs:     Sudden numbness in arms or legs:     Sudden onset of difficulty speaking or slurred speech:    Temporary loss of vision in one eye:     Problems with dizziness:         Gastrointestinal    Blood in stool:     Vomited blood:         Genitourinary    Burning when urinating:     Blood in urine:        Psychiatric    Major depression:         Hematologic    Bleeding problems:    Problems with blood clotting too easily:        Skin    Rashes or ulcers:        Constitutional    Fever or chills:     PHYSICAL EXAM:   Vitals:   08/05/20 1433  BP: (!) 164/75  Pulse: 68  Resp: 20  Temp: 97.7 F (36.5 C)  SpO2: 97%  Weight: 240 lb (108.9 kg)  Height: 5\' 9"  (1.753 m)    Constitutional: Well appearing in no distress. Appears well nourished.  Neurologic: Normal gait and station. CN intact.  No weakness.  No sensory loss. Psychiatric: Mood and affect symmetric and appropriate. Eyes: No icterus.  No conjunctival pallor. Ears, nose, throat: mucous  membranes moist.  Midline trachea.  Cardiac: regular rate and rhythm.  Respiratory: Unlabored Abdominal: soft,  non-tender, non-distended.  Peripheral vascular: 2+ radial pulse on the left 2+ brachial pulse on the left Extremity: No edema.  No cyanosis.  No pallor.  Skin: No gangrene.  No ulceration.  Lymphatic: No Stemmer's sign.  No palpable lymphadenopathy.  DATA REVIEW:    Most recent CBC CBC Latest Ref Rng & Units 04/01/2014 12/31/2013 07/02/2013  WBC 3.9 - 10.3 10e3/uL 8.8 7.6 7.0  Hemoglobin 11.6 - 15.9 g/dL 12.9 13.0 13.2  Hematocrit 34 - 46 % 38.3 38.1 38.8  Platelets 145 - 400 10e3/uL 247 321 271     Most recent CMP CMP Latest Ref Rng & Units 12/31/2013 07/02/2013 12/22/2012  Glucose 70 - 140 mg/dl 134 112 120(H)  BUN 7.0 - 26.0 mg/dL 29.2(H) 27.7(H) 32.1(H)  Creatinine 0.6 - 1.1 mg/dL 1.8(H) 1.6(H) 1.7(H)  Sodium 136 - 145 mEq/L 145 143 146(H)  Potassium 3.5 - 5.1 mEq/L 4.9 4.5 4.4  Chloride 98 - 107 mEq/L - - 108(H)  CO2 22 - 29 mEq/L 22 24 25   Calcium 8.4 - 10.4 mg/dL 10.0 9.9 9.8  Total Protein 6.4 - 8.3 g/dL 7.0 7.3 6.9  Total Bilirubin 0.20 - 1.20 mg/dL 0.25 0.30 0.28  Alkaline Phos 40 - 150 U/L 70 70 86  AST 5 - 34 U/L 12 14 16   ALT 0 - 55 U/L 12 13 17       Yevonne Aline. Stanford Breed, MD [facility] Phone Number: [phone] 08/05/2020 2:29 PM

## 2020-08-05 NOTE — Patient Instructions (Signed)
Thanks

## 2020-08-06 ENCOUNTER — Other Ambulatory Visit: Payer: Self-pay

## 2020-08-07 ENCOUNTER — Encounter (HOSPITAL_COMMUNITY): Payer: Self-pay | Admitting: *Deleted

## 2020-08-07 ENCOUNTER — Other Ambulatory Visit (HOSPITAL_COMMUNITY)
Admission: RE | Admit: 2020-08-07 | Discharge: 2020-08-07 | Disposition: A | Discharge status: Home / Self Care | Payer: Medicare Other | Source: Ambulatory Visit | Attending: Vascular Surgery | Admitting: Vascular Surgery

## 2020-08-07 ENCOUNTER — Other Ambulatory Visit: Payer: Self-pay

## 2020-08-07 NOTE — Progress Notes (Signed)
Ms Tauer denies chest pain or shortness of breathe.  Patient was tested for Covid today and has been in quarantine with family since that time.  Ms Nation reports that she has a hear murmer, heard by PCP as an adult. Patient denies seeing a cardiologist or having an ECHO.  Ms. Zender has type II diabetes, patient reports that last a1c was 5.8; patient does not take any medications or check CBGs.

## 2020-08-07 NOTE — Anesthesia Preprocedure Evaluation (Addendum)
Anesthesia Evaluation  Patient identified by MRN, date of birth, ID band Patient awake    Reviewed: Allergy & Precautions, NPO status , Patient's Chart, lab work & pertinent test results  History of Anesthesia Complications Negative for: history of anesthetic complications  Airway Mallampati: II  TM Distance: >3 FB Neck ROM: Full    Dental   Pulmonary asthma , sleep apnea ,    Pulmonary exam normal        Cardiovascular hypertension, Normal cardiovascular exam     Neuro/Psych Anxiety Depression negative neurological ROS     GI/Hepatic negative GI ROS, Neg liver ROS,   Endo/Other  diabetesHypothyroidism   Renal/GU ESRFRenal disease  negative genitourinary   Musculoskeletal  (+) Arthritis , Osteoarthritis,    Abdominal   Peds  Hematology negative hematology ROS (+) anemia ,   Anesthesia Other Findings   Reproductive/Obstetrics                            Anesthesia Physical Anesthesia Plan  ASA: III  Anesthesia Plan: Regional   Post-op Pain Management:    Induction: Intravenous  PONV Risk Score and Plan: 2 and Treatment may vary due to age or medical condition, Propofol infusion and Ondansetron  Airway Management Planned: Nasal Cannula and Simple Face Mask  Additional Equipment: None  Intra-op Plan:   Post-operative Plan: Extubation in OR  Informed Consent: I have reviewed the patients History and Physical, chart, labs and discussed the procedure including the risks, benefits and alternatives for the proposed anesthesia with the patient or authorized representative who has indicated his/her understanding and acceptance.       Plan Discussed with:   Anesthesia Plan Comments:      Anesthesia Quick Evaluation

## 2020-08-08 ENCOUNTER — Ambulatory Visit (HOSPITAL_COMMUNITY): Payer: Medicare Other | Admitting: Anesthesiology

## 2020-08-08 ENCOUNTER — Ambulatory Visit (HOSPITAL_COMMUNITY)
Admission: RE | Admit: 2020-08-08 | Discharge: 2020-08-08 | Disposition: A | Discharge status: Home / Self Care | Payer: Medicare Other | Attending: Vascular Surgery | Admitting: Vascular Surgery

## 2020-08-08 ENCOUNTER — Encounter (HOSPITAL_COMMUNITY)
Admission: RE | Disposition: A | Discharge status: Home / Self Care | Payer: Self-pay | Source: Home / Self Care | Attending: Vascular Surgery

## 2020-08-08 ENCOUNTER — Encounter (HOSPITAL_COMMUNITY): Payer: Self-pay

## 2020-08-08 DIAGNOSIS — I120 Hypertensive chronic kidney disease with stage 5 chronic kidney disease or end stage renal disease: Secondary | ICD-10-CM | POA: Insufficient documentation

## 2020-08-08 DIAGNOSIS — Z79899 Other long term (current) drug therapy: Secondary | ICD-10-CM | POA: Diagnosis not present

## 2020-08-08 DIAGNOSIS — N185 Chronic kidney disease, stage 5: Secondary | ICD-10-CM | POA: Diagnosis not present

## 2020-08-08 DIAGNOSIS — J45909 Unspecified asthma, uncomplicated: Secondary | ICD-10-CM | POA: Diagnosis not present

## 2020-08-08 DIAGNOSIS — D631 Anemia in chronic kidney disease: Secondary | ICD-10-CM | POA: Insufficient documentation

## 2020-08-08 DIAGNOSIS — Z20822 Contact with and (suspected) exposure to covid-19: Secondary | ICD-10-CM | POA: Diagnosis not present

## 2020-08-08 DIAGNOSIS — Z8249 Family history of ischemic heart disease and other diseases of the circulatory system: Secondary | ICD-10-CM | POA: Diagnosis not present

## 2020-08-08 DIAGNOSIS — N186 End stage renal disease: Secondary | ICD-10-CM | POA: Diagnosis present

## 2020-08-08 DIAGNOSIS — Z9109 Other allergy status, other than to drugs and biological substances: Secondary | ICD-10-CM | POA: Diagnosis not present

## 2020-08-08 DIAGNOSIS — E1122 Type 2 diabetes mellitus with diabetic chronic kidney disease: Secondary | ICD-10-CM | POA: Diagnosis not present

## 2020-08-08 DIAGNOSIS — I7389 Other specified peripheral vascular diseases: Secondary | ICD-10-CM | POA: Insufficient documentation

## 2020-08-08 DIAGNOSIS — Z832 Family history of diseases of the blood and blood-forming organs and certain disorders involving the immune mechanism: Secondary | ICD-10-CM | POA: Diagnosis not present

## 2020-08-08 DIAGNOSIS — Z8269 Family history of other diseases of the musculoskeletal system and connective tissue: Secondary | ICD-10-CM | POA: Insufficient documentation

## 2020-08-08 HISTORY — PX: AV FISTULA PLACEMENT: SHX1204

## 2020-08-08 HISTORY — DX: Chronic kidney disease, unspecified: N18.9

## 2020-08-08 HISTORY — DX: Pneumonia, unspecified organism: J18.9

## 2020-08-08 HISTORY — DX: Unspecified asthma, uncomplicated: J45.909

## 2020-08-08 HISTORY — DX: Personal history of other medical treatment: Z92.89

## 2020-08-08 HISTORY — DX: Cardiac murmur, unspecified: R01.1

## 2020-08-08 LAB — POCT I-STAT, CHEM 8
BUN: 51 mg/dL — ABNORMAL HIGH (ref 8–23)
Calcium, Ion: 1 mmol/L — ABNORMAL LOW (ref 1.15–1.40)
Chloride: 110 mmol/L (ref 98–111)
Creatinine, Ser: 13 mg/dL — ABNORMAL HIGH (ref 0.44–1.00)
Glucose, Bld: 82 mg/dL (ref 70–99)
HCT: 25 % — ABNORMAL LOW (ref 36.0–46.0)
Hemoglobin: 8.5 g/dL — ABNORMAL LOW (ref 12.0–15.0)
Potassium: 5.1 mmol/L (ref 3.5–5.1)
Sodium: 137 mmol/L (ref 135–145)
TCO2: 13 mmol/L — ABNORMAL LOW (ref 22–32)

## 2020-08-08 LAB — SARS CORONAVIRUS 2 BY RT PCR (HOSPITAL ORDER, PERFORMED IN ~~LOC~~ HOSPITAL LAB): SARS Coronavirus 2: NEGATIVE

## 2020-08-08 LAB — GLUCOSE, CAPILLARY: Glucose-Capillary: 144 mg/dL — ABNORMAL HIGH (ref 70–99)

## 2020-08-08 SURGERY — ARTERIOVENOUS (AV) FISTULA CREATION
Anesthesia: Regional | Laterality: Left

## 2020-08-08 MED ORDER — MIDAZOLAM HCL 2 MG/2ML IJ SOLN
INTRAMUSCULAR | Status: AC
  Filled 2020-08-08: qty 2

## 2020-08-08 MED ORDER — SODIUM CHLORIDE 0.9 % IV SOLN: INTRAVENOUS | Status: DC | PRN

## 2020-08-08 MED ORDER — ONDANSETRON HCL 4 MG/2ML IJ SOLN: 4.0000 mg | Freq: Once | INTRAMUSCULAR | Status: DC | PRN

## 2020-08-08 MED ORDER — OXYCODONE HCL 5 MG PO TABS: 5.0000 mg | ORAL_TABLET | Freq: Once | ORAL | Status: DC | PRN

## 2020-08-08 MED ORDER — CEFAZOLIN SODIUM-DEXTROSE 2-4 GM/100ML-% IV SOLN
2.0000 g | INTRAVENOUS | Status: AC
  Administered 2020-08-08: 2 g via INTRAVENOUS
  Filled 2020-08-08: qty 100

## 2020-08-08 MED ORDER — LIDOCAINE-EPINEPHRINE (PF) 1.5 %-1:200000 IJ SOLN
INTRAMUSCULAR | Status: DC | PRN
  Administered 2020-08-08: 20 mL via PERINEURAL
  Administered 2020-08-08: 30 mL via PERINEURAL

## 2020-08-08 MED ORDER — FENTANYL CITRATE (PF) 250 MCG/5ML IJ SOLN
INTRAMUSCULAR | Status: DC | PRN
  Administered 2020-08-08: 25 ug via INTRAVENOUS
  Administered 2020-08-08: 50 ug via INTRAVENOUS
  Administered 2020-08-08: 25 ug via INTRAVENOUS

## 2020-08-08 MED ORDER — SODIUM CHLORIDE 0.9 % IV SOLN
INTRAVENOUS | Status: DC | PRN
  Administered 2020-08-08: 500 mL

## 2020-08-08 MED ORDER — OXYCODONE-ACETAMINOPHEN 5-325 MG PO TABS
1.0000 | ORAL_TABLET | ORAL | 0 refills | Status: DC | PRN
Start: 2020-08-08 — End: 2020-09-11

## 2020-08-08 MED ORDER — PROPOFOL 10 MG/ML IV BOLUS
INTRAVENOUS | Status: DC | PRN
  Administered 2020-08-08: 20 mg via INTRAVENOUS

## 2020-08-08 MED ORDER — LIDOCAINE-EPINEPHRINE 1 %-1:100000 IJ SOLN
INTRAMUSCULAR | Status: AC
  Filled 2020-08-08: qty 1

## 2020-08-08 MED ORDER — FENTANYL CITRATE (PF) 100 MCG/2ML IJ SOLN: 25.0000 ug | INTRAMUSCULAR | Status: DC | PRN

## 2020-08-08 MED ORDER — CHLORHEXIDINE GLUCONATE 0.12 % MT SOLN
15.0000 mL | Freq: Once | OROMUCOSAL | Status: AC
  Administered 2020-08-08: 15 mL via OROMUCOSAL

## 2020-08-08 MED ORDER — CHLORHEXIDINE GLUCONATE 4 % EX LIQD: 60.0000 mL | Freq: Once | CUTANEOUS | Status: DC

## 2020-08-08 MED ORDER — ONDANSETRON HCL 4 MG/2ML IJ SOLN
INTRAMUSCULAR | Status: DC | PRN
  Administered 2020-08-08: 4 mg via INTRAVENOUS

## 2020-08-08 MED ORDER — PHENYLEPHRINE HCL (PRESSORS) 10 MG/ML IV SOLN
INTRAVENOUS | Status: DC | PRN
  Administered 2020-08-08: 80 ug via INTRAVENOUS

## 2020-08-08 MED ORDER — PROPOFOL 10 MG/ML IV BOLUS
INTRAVENOUS | Status: AC
  Filled 2020-08-08: qty 20

## 2020-08-08 MED ORDER — HEPARIN SODIUM (PORCINE) 1000 UNIT/ML IJ SOLN
INTRAMUSCULAR | Status: DC | PRN
  Administered 2020-08-08: 5000 [IU] via INTRAVENOUS

## 2020-08-08 MED ORDER — OXYCODONE HCL 5 MG/5ML PO SOLN: 5.0000 mg | Freq: Once | ORAL | Status: DC | PRN

## 2020-08-08 MED ORDER — MIDAZOLAM HCL 2 MG/2ML IJ SOLN
INTRAMUSCULAR | Status: DC | PRN
  Administered 2020-08-08: 2 mg via INTRAVENOUS

## 2020-08-08 MED ORDER — AMISULPRIDE (ANTIEMETIC) 5 MG/2ML IV SOLN: 10.0000 mg | Freq: Once | INTRAVENOUS | Status: DC | PRN

## 2020-08-08 MED ORDER — PROPOFOL 500 MG/50ML IV EMUL
INTRAVENOUS | Status: DC | PRN
  Administered 2020-08-08: 100 ug/kg/min via INTRAVENOUS

## 2020-08-08 MED ORDER — SODIUM CHLORIDE 0.9 % IV SOLN
INTRAVENOUS | Status: AC
  Filled 2020-08-08: qty 1.2

## 2020-08-08 MED ORDER — PAPAVERINE HCL 30 MG/ML IJ SOLN
INTRAMUSCULAR | Status: AC
  Filled 2020-08-08: qty 2

## 2020-08-08 MED ORDER — SODIUM CHLORIDE (PF) 0.9 % IJ SOLN
INTRAMUSCULAR | Status: DC | PRN
  Administered 2020-08-08: 60 mL

## 2020-08-08 MED ORDER — 0.9 % SODIUM CHLORIDE (POUR BTL) OPTIME
TOPICAL | Status: DC | PRN
  Administered 2020-08-08: 1000 mL

## 2020-08-08 MED ORDER — FENTANYL CITRATE (PF) 250 MCG/5ML IJ SOLN
INTRAMUSCULAR | Status: AC
  Filled 2020-08-08: qty 5

## 2020-08-08 MED ORDER — CHLORHEXIDINE GLUCONATE 0.12 % MT SOLN
OROMUCOSAL | Status: AC
  Filled 2020-08-08: qty 15

## 2020-08-08 MED ORDER — SODIUM CHLORIDE 0.9 % IV SOLN: INTRAVENOUS | Status: DC

## 2020-08-08 SURGICAL SUPPLY — 40 items
ADH SKN CLS LQ APL DERMABOND (GAUZE/BANDAGES/DRESSINGS) ×1
APL PRP STRL LF DISP 70% ISPRP (MISCELLANEOUS) ×1
APL SKNCLS STERI-STRIP NONHPOA (GAUZE/BANDAGES/DRESSINGS) ×1
ARMBAND PINK RESTRICT EXTREMIT (MISCELLANEOUS) ×2 IMPLANT
BENZOIN TINCTURE PRP APPL 2/3 (GAUZE/BANDAGES/DRESSINGS) ×2 IMPLANT
CANISTER SUCT 3000ML PPV (MISCELLANEOUS) ×2 IMPLANT
CANNULA VESSEL 3MM 2 BLNT TIP (CANNULA) ×2 IMPLANT
CHLORAPREP W/TINT 26 (MISCELLANEOUS) ×2 IMPLANT
CLIP VESOCCLUDE MED 6/CT (CLIP) ×2 IMPLANT
CLIP VESOCCLUDE SM WIDE 6/CT (CLIP) ×3 IMPLANT
COVER PROBE W GEL 5X96 (DRAPES) ×1 IMPLANT
COVER SURGICAL LIGHT HANDLE (MISCELLANEOUS) ×1 IMPLANT
COVER WAND RF STERILE (DRAPES) ×2 IMPLANT
DERMABOND ADHESIVE PROPEN (GAUZE/BANDAGES/DRESSINGS) ×1
DERMABOND ADVANCED .7 DNX6 (GAUZE/BANDAGES/DRESSINGS) IMPLANT
ELECT CAUTERY BLADE 6.4 (BLADE) IMPLANT
ELECT REM PT RETURN 9FT ADLT (ELECTROSURGICAL) ×2
ELECTRODE REM PT RTRN 9FT ADLT (ELECTROSURGICAL) ×1 IMPLANT
GLOVE SURG SS PI 8.0 STRL IVOR (GLOVE) ×2 IMPLANT
GOWN STRL REUS W/ TWL LRG LVL3 (GOWN DISPOSABLE) ×2 IMPLANT
GOWN STRL REUS W/ TWL XL LVL3 (GOWN DISPOSABLE) ×1 IMPLANT
GOWN STRL REUS W/TWL LRG LVL3 (GOWN DISPOSABLE) ×4
GOWN STRL REUS W/TWL XL LVL3 (GOWN DISPOSABLE) ×2
INSERT FOGARTY SM (MISCELLANEOUS) IMPLANT
KIT BASIN OR (CUSTOM PROCEDURE TRAY) ×2 IMPLANT
KIT TURNOVER KIT B (KITS) ×2 IMPLANT
NS IRRIG 1000ML POUR BTL (IV SOLUTION) ×2 IMPLANT
PACK CV ACCESS (CUSTOM PROCEDURE TRAY) ×2 IMPLANT
PAD ARMBOARD 7.5X6 YLW CONV (MISCELLANEOUS) ×4 IMPLANT
PENCIL SMOKE EVACUATOR (MISCELLANEOUS) ×1 IMPLANT
STRIP CLOSURE SKIN 1/2X4 (GAUZE/BANDAGES/DRESSINGS) ×2 IMPLANT
SUT MNCRL AB 4-0 PS2 18 (SUTURE) ×2 IMPLANT
SUT PROLENE 6 0 BV (SUTURE) ×4 IMPLANT
SUT VIC AB 3-0 SH 27 (SUTURE) ×2
SUT VIC AB 3-0 SH 27X BRD (SUTURE) ×1 IMPLANT
SYR 30ML LL (SYRINGE) ×1 IMPLANT
SYR BULB IRRIG 60ML STRL (SYRINGE) ×2 IMPLANT
TOWEL GREEN STERILE (TOWEL DISPOSABLE) ×2 IMPLANT
UNDERPAD 30X36 HEAVY ABSORB (UNDERPADS AND DIAPERS) ×2 IMPLANT
WATER STERILE IRR 1000ML POUR (IV SOLUTION) ×2 IMPLANT

## 2020-08-08 NOTE — Anesthesia Postprocedure Evaluation (Signed)
Anesthesia Post Note  Patient: [name]  Procedure(s) Performed: LEFT UPPER EXTREMITY ARTERIOVENOUS (AV) FISTULA CREATION (Left )     Patient location during evaluation: PACU Anesthesia Type: Regional Level of consciousness: awake and alert Pain management: pain level controlled Vital Signs Assessment: post-procedure vital signs reviewed and stable Respiratory status: spontaneous breathing, nonlabored ventilation and respiratory function stable Cardiovascular status: blood pressure returned to baseline and stable Postop Assessment: no apparent nausea or vomiting Anesthetic complications: no   No complications documented.  Last Vitals:  Vitals:   08/08/20 0613 08/08/20 0935  Pulse: 67   Resp: 18   Temp: 36.8 C 36.5 C  SpO2: 98%     Last Pain:  Vitals:   08/08/20 0935  TempSrc:   PainSc: 0-No pain                 Lidia Collum

## 2020-08-08 NOTE — Transfer of Care (Signed)
Immediate Anesthesia Transfer of Care Note  Patient: Shrita G Eden Lathe  Procedure(s) Performed: LEFT UPPER EXTREMITY ARTERIOVENOUS (AV) FISTULA CREATION (Left )  Patient Location: PACU  Anesthesia Type:MAC and Regional  Level of Consciousness: awake, alert  and oriented  Airway & Oxygen Therapy: Patient Spontanous Breathing  Post-op Assessment: Report given to RN and Post -op Vital signs reviewed and stable  Post vital signs: Reviewed and stable  Last Vitals:  Vitals Value Taken Time  BP 152/66 08/08/20 0936  Temp 36.5 C 08/08/20 0935  Pulse 70 08/08/20 0938  Resp 10 08/08/20 0938  SpO2 94 % 08/08/20 0938  Vitals shown include unvalidated device data.  Last Pain:  Vitals:   08/08/20 0935  TempSrc:   PainSc: 0-No pain      Patients Stated Pain Goal: 2 (23/53/61 4431)  Complications: No complications documented.

## 2020-08-08 NOTE — Discharge Instructions (Signed)
[facility]  Discharge Instructions  AV Fistula or Graft Surgery for Dialysis Access  Please refer to the following instructions for your post-procedure care. Your surgeon or physician assistant will discuss any changes with you.  Activity  You may drive the day following your surgery, if you are comfortable and no longer taking prescription pain medication. Resume full activity as the soreness in your incision resolves.  Bathing/Showering  You may shower after you go home. Keep your incision dry for 48 hours. Do not soak in a bathtub, hot tub, or swim until the incision heals completely. You may not shower if you have a hemodialysis catheter.  Incision Care  Clean your incision with mild soap and water after 48 hours. Pat the area dry with a clean towel. You do not need a bandage unless otherwise instructed. Do not apply any ointments or creams to your incision. You may have skin glue on your incision. Do not peel it off. It will come off on its own in about one week. Your arm may swell a bit after surgery. To reduce swelling use pillows to elevate your arm so it is above your heart. Your doctor will tell you if you need to lightly wrap your arm with an ACE bandage.  Diet  Resume your normal diet. There are not special food restrictions following this procedure. In order to heal from your surgery, it is CRITICAL to get adequate nutrition. Your body requires vitamins, minerals, and protein. Vegetables are the best source of vitamins and minerals. Vegetables also provide the perfect balance of protein. Processed food has little nutritional value, so try to avoid this.  Medications  Resume taking all of your medications. If your incision is causing pain, you may take over-the counter pain relievers such as acetaminophen (Tylenol). If you were prescribed a stronger pain medication, please be aware these medications can cause nausea and constipation. Prevent  nausea by taking the medication with a snack or meal. Avoid constipation by drinking plenty of fluids and eating foods with high amount of fiber, such as fruits, vegetables, and grains.   Do not take Tylenol if you are taking prescription pain medications.  Follow up Your surgeon may want to see you in the office following your access surgery. If so, this will be arranged at the time of your surgery.  Please call us immediately for any of the following conditions:  . Increased pain, redness, drainage (pus) from your incision site . Fever of 101 degrees or higher . Severe or worsening pain at your incision site . Hand pain or numbness. .  Reduce your risk of vascular disease:  . Stop smoking. If you would like help, call [facility] at 1-800-QUIT-NOW 917-236-9939) or [place] at 253-586-7496  . Manage your cholesterol . Maintain a desired weight . Control your diabetes . Keep your blood pressure down  Dialysis  It will take several weeks to several months for your new dialysis access to be ready for use. Your surgeon will determine when it is okay to use it. Your nephrologist will continue to direct your dialysis. You can continue to use your Permcath until your new access is ready for use.   08/08/2020 [name] [id number] Dec 08, 1945  Surgeon(s): Cherre Robins, MD  Procedure(s): LEFT UPPER EXTREMITY ARTERIOVENOUS (AV) FISTULA CREATION   May stick graft immediately   May stick graft on designated area only:   X Do not stick left upper arm AV fistula for  12 weeks    If you have any questions, please call the office at [phone].

## 2020-08-08 NOTE — Progress Notes (Signed)
Orthopedic Tech Progress Note Patient Details:  [name] Mar 03, 1946 [id number] Left Arm sling bedside for patient. Ortho Devices Type of Ortho Device: Arm sling Ortho Device/Splint Location: ULE Ortho Device/Splint Interventions: Ordered       Marquis Down A Antawn Sison 08/08/2020, 12:17 PM

## 2020-08-08 NOTE — Anesthesia Procedure Notes (Signed)
Anesthesia Regional Block: Supraclavicular block   Pre-Anesthetic Checklist: ,, timeout performed, Correct Patient, Correct Site, Correct Laterality, Correct Procedure, Correct Position, site marked, Risks and benefits discussed,  Surgical consent,  Pre-op evaluation,  At surgeon's request and post-op pain management  Laterality: Left  Prep: chloraprep       Needles:  Injection technique: Single-shot  Needle Type: Echogenic Stimulator Needle     Needle Length: 10cm  Needle Gauge: 20     Additional Needles:   Procedures:,,,, ultrasound used (permanent image in chart),,,,  Narrative:  Start time: 08/08/2020 7:35 AM End time: 08/08/2020 7:39 AM Injection made incrementally with aspirations every 5 mL.  Performed by: Personally  Anesthesiologist: Lidia Collum, MD  Additional Notes: Standard monitors applied. Skin prepped. Good needle visualization with ultrasound. Injection made in 5cc increments with no resistance to injection. Patient tolerated the procedure well.  Supplementary intercostobrachial block performed in the left axilla with 20ga B Braun, 20 mL 1.5% lidocaine w/ epi

## 2020-08-08 NOTE — Interval H&P Note (Signed)
History and Physical Interval Note:  08/08/2020 7:13 AM  [name]  has presented today for surgery, with the diagnosis of ESRD.  The various methods of treatment have been discussed with the patient and family. After consideration of risks, benefits and other options for treatment, the patient has consented to  Procedure(s): LEFT UPPER EXTREMITY ARTERIOVENOUS (AV) FISTULA CREATION (Left) as a surgical intervention.  The patient's history has been reviewed, patient examined, no change in status, stable for surgery.  I have reviewed the patient's chart and labs.  Questions were answered to the patient's satisfaction.     Cherre Robins

## 2020-08-08 NOTE — Op Note (Addendum)
DATE OF SERVICE: 08/08/2020  PATIENT:  [name]  74 y.o. female  PRE-OPERATIVE DIAGNOSIS:  ESRD  POST-OPERATIVE DIAGNOSIS:  * No post-op diagnosis entered *  PROCEDURE: Left brachiocephalic arteriovenous fistula  SURGEON:  Surgeon(s) and Role:    * Cherre Robins, MD - Primary  ASSISTANT: Corrie Baglia, PA-C  ANESTHESIA:   regional  EBL:  5 mL   BLOOD ADMINISTERED:none  DRAINS: none   LOCAL MEDICATIONS USED:  NONE  SPECIMEN:  none  DISPOSITION OF SPECIMEN:  n/a  COUNTS: confirmed correct.  TOURNIQUET:  * No tourniquets in log *  PLAN OF CARE: Discharge to home after PACU  PATIENT DISPOSITION:  PACU - hemodynamically stable.   Delay start of Pharmacological VTE agent (>24hrs) due to surgical blood loss or risk of bleeding: no  INDICATION FOR PROCEDURE: [name] is a 74 y.o. female with CKD V nearing ESRD and need for HD. After careful discussion of risks, benefits, and alternatives the patient was offered left brachiocephalic arteriovenous fistula. We specifically discussed the need for maintenance of HD access and risk of steal syndrome. The patient understood and wished to proceed.  DESCRIPTION OF PROCEDURE: After identification of the patient in the pre-operative holding area, the patient was transferred to the operating room. The patient was positioned supine on the operating room table. Anesthesia was induced. The left arm was prepped and draped in standard fashion. A surgical pause was performed confirming correct patient, procedure, and operative location.  Using intraoperative ultrasound, the course of the brachial artery and cephalic vein were mapped.  An incision was planned over the antecubital fossa just distal to the crease of the arm.  A transverse incision was made.  Incision was carried down through subcutaneous tissue using Bovie electrocautery.  The cephalic vein was identified in the antecubital fossa and skeletonized.  Cephalic vein was  noted to to have multiple branch points which were ligated.  The fascia of the aponeurosis of the biceps was divided in the brachial artery identified.  Brachial artery was skeletonized and encircled with Silastic Vesseloops.  Patient was heparinized with 5000 units of IV heparin.  The artery was clamped proximally and distally.  An anterior arteriotomy was made with an 11 blade.  This was extended with Potts scissors to a total length of 6 mm.  The cephalic vein was transected.  The distal stump was oversewn with a 2-0 silk.  The cephalic vein was sewn end-to-side to the brachial artery using continuous suture of 6-0 Prolene.  Immediately prior to completion the anastomosis was flushed and de-aired.  The anastomosis was completed.  Hemostasis was achieved.  A week thrill was felt at the proximal graft.  An excellent bruit was auscultated with Doppler throughout the course of the proximal fistula.  Radial pulses present upon completion.  Satisfied vein to the case here.  The incision was closed with 3-0 Vicryl and 4-0 Monocryl.  Sterile dressing was applied.  Upon completion of the case instrument and sharps counts were confirmed correct. The patient was transferred to the PACU in good condition. I was present for all portions of the procedure.  Yevonne Aline. Stanford Breed, MD [facility] Phone Number: [phone] 08/08/2020 9:16 AM

## 2020-08-09 ENCOUNTER — Encounter (HOSPITAL_COMMUNITY): Payer: Self-pay | Admitting: Vascular Surgery

## 2020-08-23 IMAGING — MG DIGITAL SCREENING BILAT W/ TOMO W/ CAD
8 series · 8 of 24 positions shown · non-contrast
Comparison: Previous exam(s).

CLINICAL DATA: Screening.

EXAM:
DIGITAL SCREENING BILATERAL MAMMOGRAM WITH TOMO AND CAD

[L MLO synth-2D]
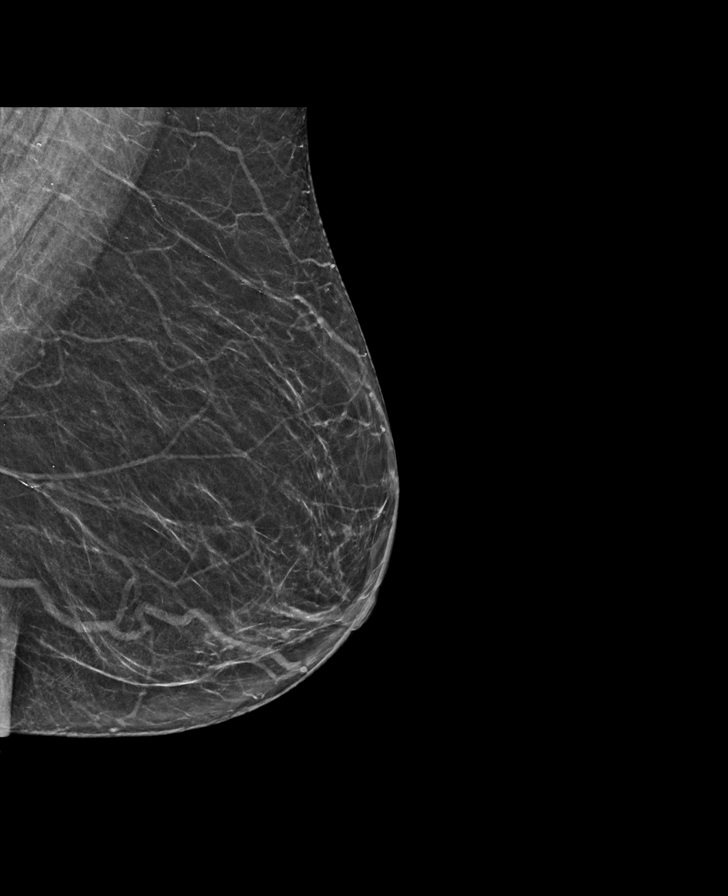

[R MLO synth-2D]
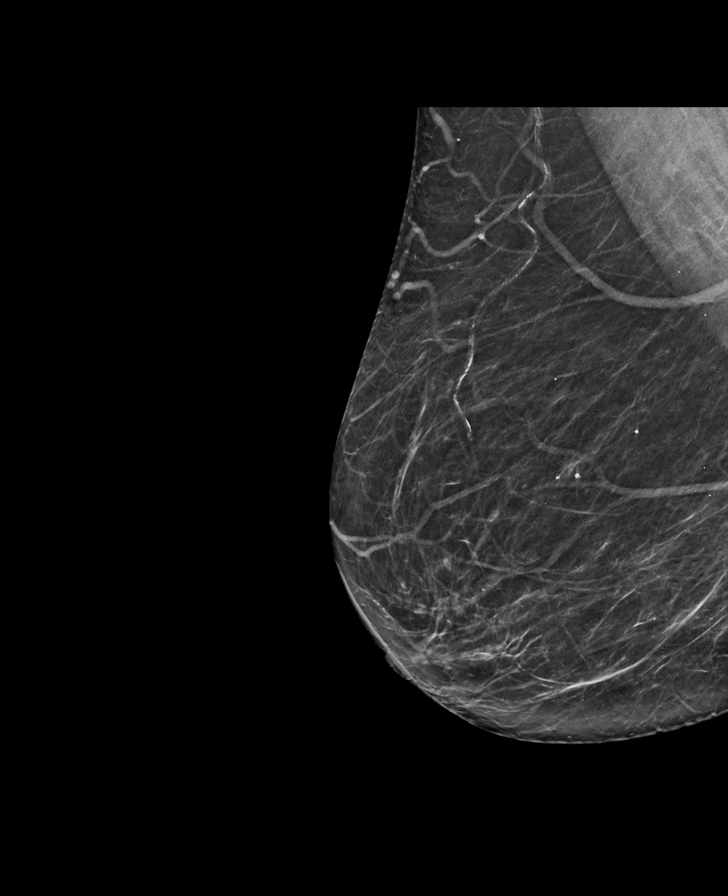

[L CC synth-2D]
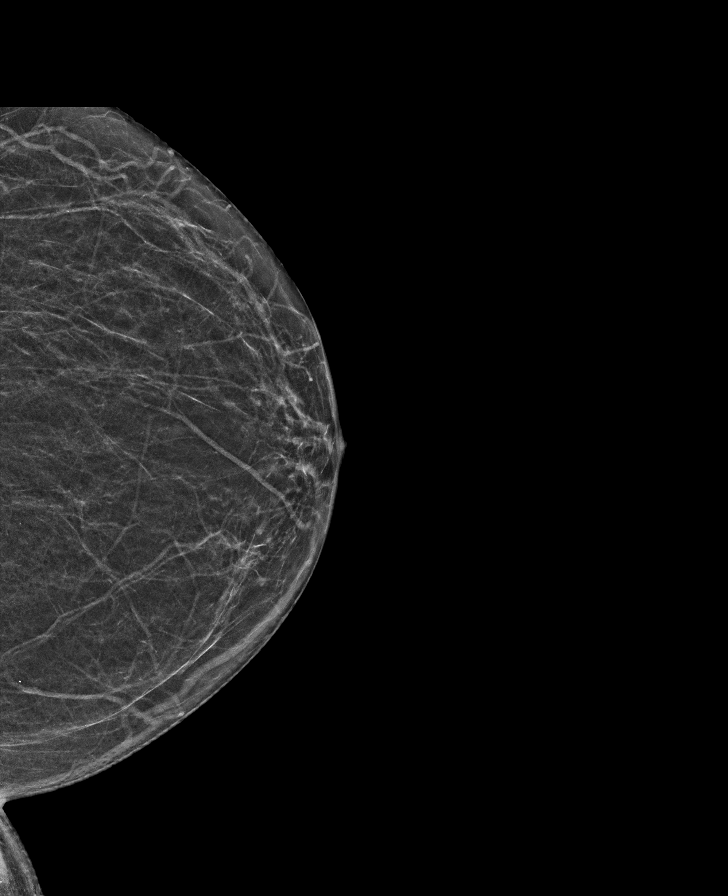

[R CC synth-2D]
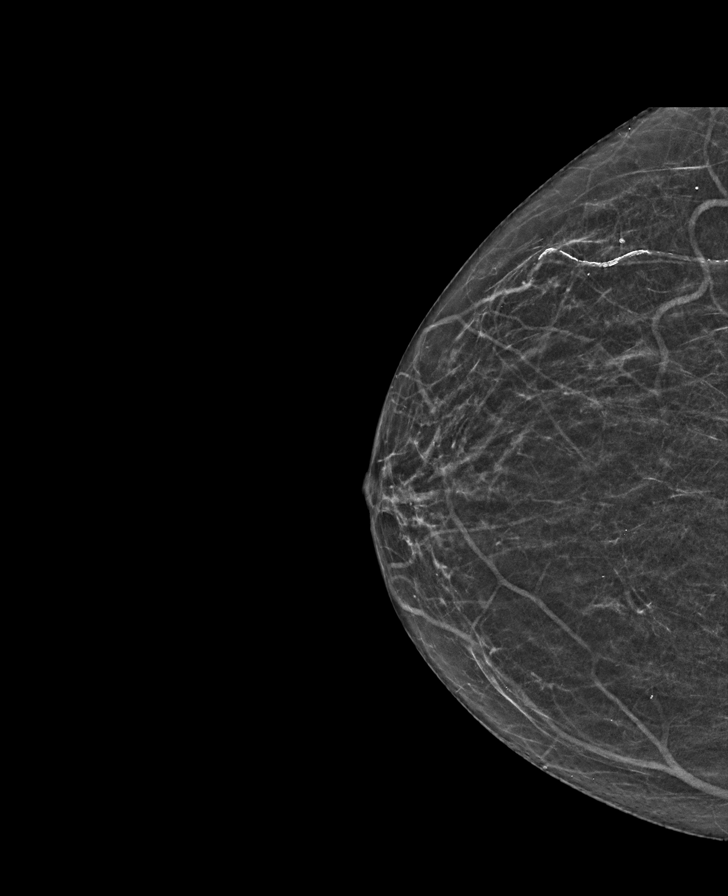

[L MLO tomo · tomo slice 31/62.0]
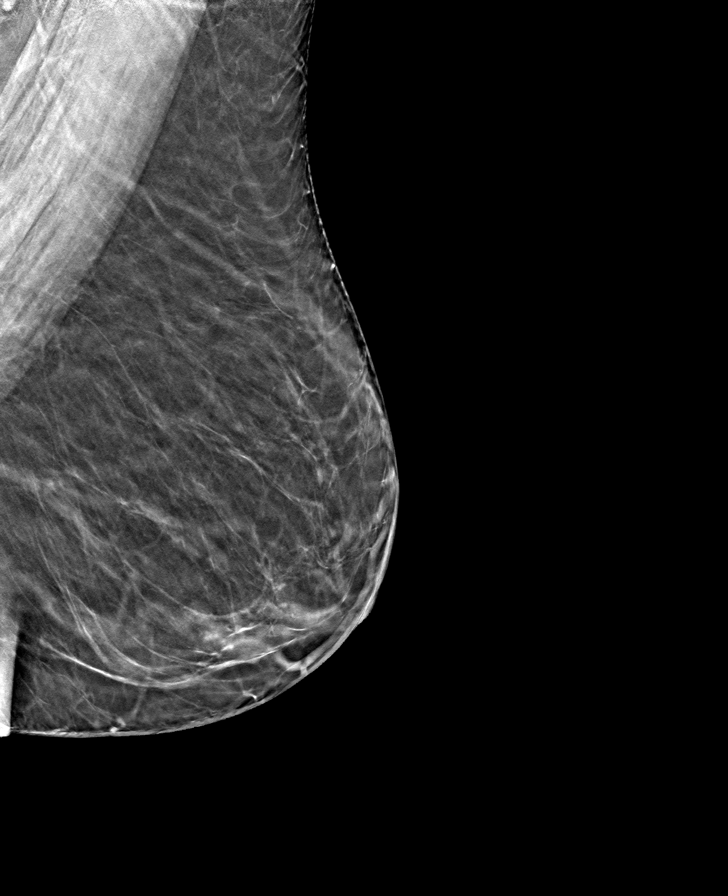

[R CC tomo · tomo slice 26/51.0]
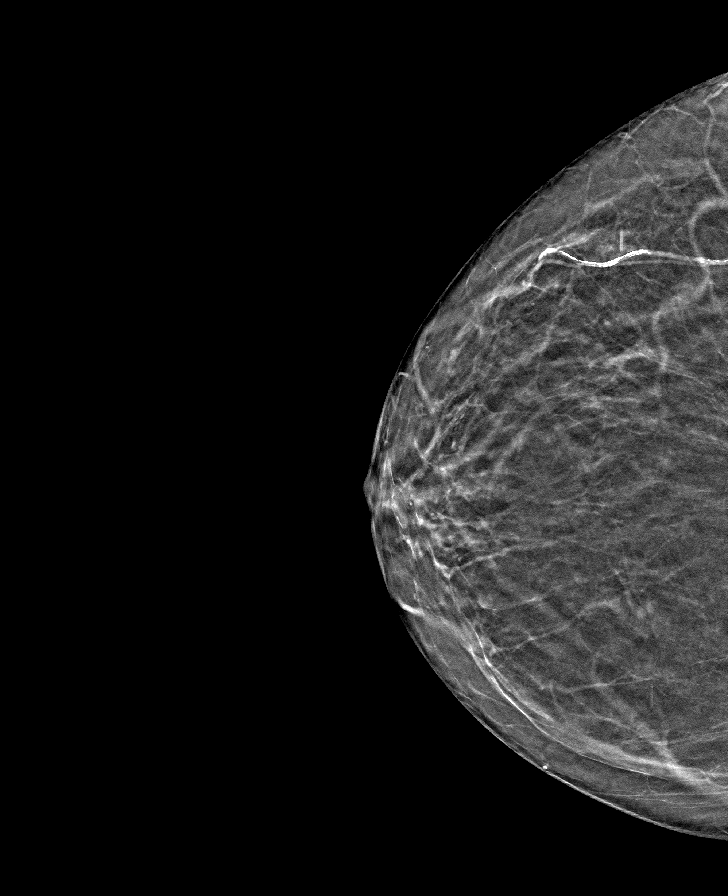

[R MLO tomo · tomo slice 35/68.0]
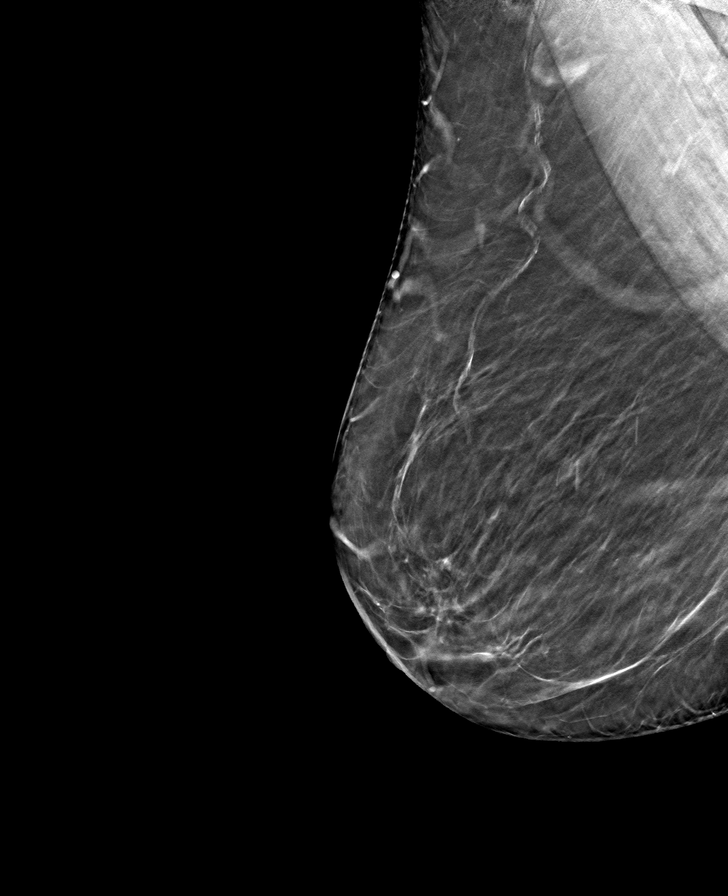

[L CC tomo · tomo slice 26/51.0]
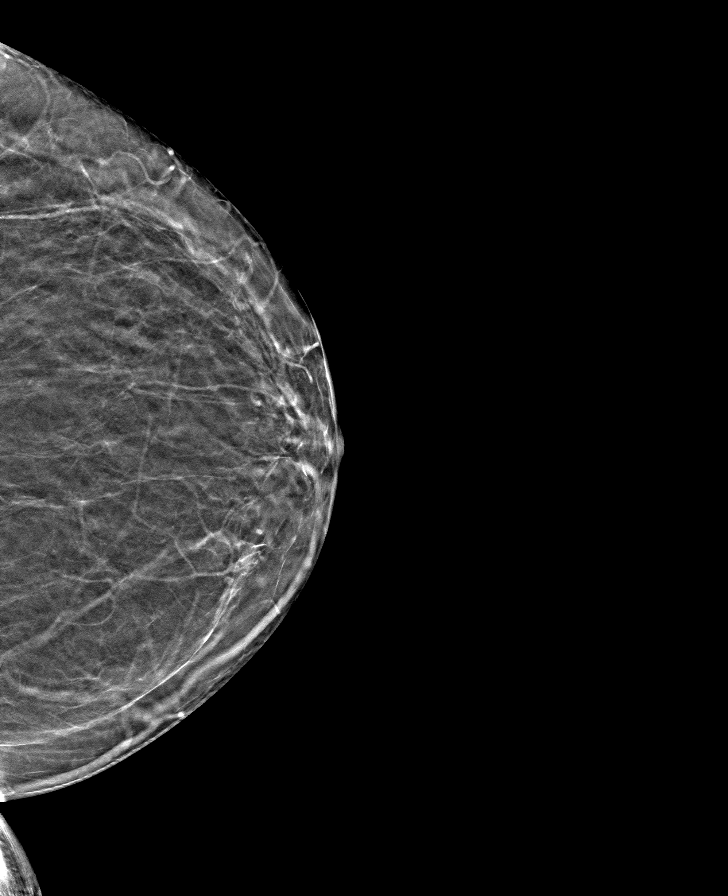

[8 of 24 positions shown; findings below may reference images not displayed]

ACR Breast Density Category b: There are scattered areas of
fibroglandular density.
FINDINGS: There are no findings suspicious for malignancy. Images were
processed with CAD.
IMPRESSION: No mammographic evidence of malignancy. A result letter of this
screening mammogram will be mailed directly to the patient.

RECOMMENDATION:
Screening mammogram in one year. (Code:[id number])

BI-RADS CATEGORY  1: Negative.

## 2020-09-02 ENCOUNTER — Other Ambulatory Visit: Payer: Self-pay

## 2020-09-02 DIAGNOSIS — N185 Chronic kidney disease, stage 5: Secondary | ICD-10-CM

## 2020-09-09 ENCOUNTER — Other Ambulatory Visit: Payer: Self-pay

## 2020-09-09 ENCOUNTER — Ambulatory Visit (HOSPITAL_COMMUNITY)
Admission: RE | Admit: 2020-09-09 | Discharge: 2020-09-09 | Disposition: A | Discharge status: Home / Self Care | Payer: Medicare Other | Source: Ambulatory Visit | Attending: Vascular Surgery | Admitting: Vascular Surgery

## 2020-09-09 ENCOUNTER — Ambulatory Visit (INDEPENDENT_AMBULATORY_CARE_PROVIDER_SITE_OTHER): Payer: Self-pay | Admitting: Vascular Surgery

## 2020-09-09 ENCOUNTER — Encounter: Payer: Self-pay | Admitting: Vascular Surgery

## 2020-09-09 VITALS — BP 156/74 | HR 71 | Temp 97.5°F | Ht 69.0 in | Wt 240.0 lb

## 2020-09-09 DIAGNOSIS — N185 Chronic kidney disease, stage 5: Secondary | ICD-10-CM | POA: Diagnosis present

## 2020-09-09 DIAGNOSIS — N184 Chronic kidney disease, stage 4 (severe): Secondary | ICD-10-CM

## 2020-09-09 NOTE — H&P (View-Only) (Signed)
ASSESSMENT & PLAN:  74 y.o. female with CKDV nearing ESRD.  I spent greater than 15 minutes explaining hemodialysis, and the specifics of dialysis access.  I counseled her that all dialysis access requires maintenance.  I explained the rationale for using autogenous tissue when possible.  I counseled her about the risks of steal syndrome.  She understood and wished to proceed with HD access creation.  Plan left upper extremity brachio-basilic AVF creation and repair of brachiocephalic AVF pseudoaneurysm 09/11/20. Because she does not yet need HD I will avoid placing a TDC.  CHIEF COMPLAINT:   CKD5  HISTORY:  HISTORY OF PRESENT ILLNESS: [name] is a 74 y.o. female who presents to clinic for evaluation of dialysis access.  She is a right-handed woman.  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  She is not sure exactly when she will need this but her impression is that this is several months away.  She is an incredibly pleasant woman who has multiple excellent questions about dialysis access today.  09/09/20: Patient presents to clinic for postoperative check. She reports bruising and swelling below her incision occurring shortly after surgery. She does not have a thrill in her AVF.   Past Medical History:  Diagnosis Date  . Anemia, iron deficiency   . Asthma    as a child  . Cancer ([place]) 03/2020   skin cancer - bil legs  . Carpal tunnel syndrome    Left hand   . CKD (chronic kidney disease)   . Depression   . 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 Gastritis   . H/O: GI bleed 09/2008  . Heart murmur    Never was sent to cardiologist  . Hemorrhoids, external   . History of blood transfusion    bleeding Hemorroids  . HTN (hypertension)   . Hyperlipidemia   . Hypothyroidism   . OA (osteoarthritis)   . Obesity   . Pagophagia   . Pneumonia   . Sleep apnea, obstructive    Does not use oxygen at bedtime   . Vitamin B12 deficiency     Past Surgical  History:  Procedure Laterality Date  . ABDOMINAL HYSTERECTOMY    . ADENOIDECTOMY    . APPENDECTOMY    . AV FISTULA PLACEMENT Left 08/08/2020   Procedure: LEFT UPPER EXTREMITY ARTERIOVENOUS (AV) FISTULA CREATION;  Surgeon: Cherre Robins, MD;  Location: [facility];  Service: Vascular;  Laterality: Left;  . COLONOSCOPY    . ESOPHAGOGASTRODUODENOSCOPY    . HEMORRHOID SURGERY    . TONSILLECTOMY      Family History  Problem Relation Age of Onset  . Hypertension Son   . Hemochromatosis Son   . Fibromyalgia Daughter     Social History   Socioeconomic History  . Marital status: Divorced    Spouse name: Not on file  . Number of children: Not on file  . Years of education: Not on file  . Highest education level: Not on file  Occupational History  . Not on file  Tobacco Use  . Smoking status: Never Smoker  . Smokeless tobacco: Never Used  Vaping Use  . Vaping Use: Never used  Substance and Sexual Activity  . Alcohol use: No  . Drug use: No  . Sexual activity: Not on file  Other Topics Concern  . Not on file  Social History Narrative  . Not on file   Social Determinants of  Health   Financial Resource Strain: Not on file  Food Insecurity: Not on file  Transportation Needs: Not on file  Physical Activity: Not on file  Stress: Not on file  Social Connections: Not on file  Intimate Partner Violence: Not on file    Allergies  Allergen Reactions  . Other     Dust and feathers    Current Outpatient Medications  Medication Sig Dispense Refill  . acetaminophen (TYLENOL) 650 MG CR tablet Take 1,300 mg by mouth 2 (two) times daily.    Marland Kitchen amLODipine (NORVASC) 10 MG tablet Take 10 mg by mouth daily.    Marland Kitchen atorvastatin (LIPITOR) 20 MG tablet Take 20 mg by mouth daily.    . betamethasone dipropionate 0.05 % cream Apply 1 application topically 2 (two) times daily as needed (rash).    Marland Kitchen FLUoxetine (PROZAC) 40 MG capsule Take 40 mg by mouth at bedtime.    . Homeopathic Products (LEG  CRAMPS PO) Take 4-6 tablets by mouth daily as needed (leg cramps).    . hydrALAZINE (APRESOLINE) 100 MG tablet Take 100 mg by mouth 3 (three) times daily.    Marland Kitchen levothyroxine (SYNTHROID, LEVOTHROID) 100 MCG tablet Take 100 mcg by mouth daily.    Marland Kitchen loratadine (CLARITIN) 10 MG tablet Take 10 mg by mouth daily.    Marland Kitchen MYRBETRIQ 50 MG TB24 tablet Take 50 mg by mouth daily.    Marland Kitchen oxybutynin (DITROPAN-XL) 10 MG 24 hr tablet Take 10 mg by mouth daily.    Marland Kitchen oxyCODONE-acetaminophen (PERCOCET) 5-325 MG tablet Take 1 tablet by mouth every 4 (four) hours as needed for severe pain. 6 tablet 0  . senna-docusate (SENOKOT-S) 8.6-50 MG tablet Take 8-10 tablets by mouth at bedtime.    . vitamin B-12 (CYANOCOBALAMIN) 1000 MCG tablet Take 1,000 mcg by mouth 4 (four) times a week.      No current facility-administered medications for this visit.    REVIEW OF SYSTEMS:  [X]  denotes positive finding, [ ]  denotes negative finding Cardiac  Comments:  Chest pain or chest pressure:    Shortness of breath upon exertion:    Short of breath when lying flat:    Irregular heart rhythm:        Vascular    Pain in calf, thigh, or hip brought on by ambulation:    Pain in feet at night that wakes you up from your sleep:     Blood clot in your veins:    Leg swelling:  x       Pulmonary    Oxygen at home:    Productive cough:     Wheezing:         Neurologic    Sudden weakness in arms or legs:     Sudden numbness in arms or legs:     Sudden onset of difficulty speaking or slurred speech:    Temporary loss of vision in one eye:     Problems with dizziness:         Gastrointestinal    Blood in stool:     Vomited blood:         Genitourinary    Burning when urinating:     Blood in urine:        Psychiatric    Major depression:         Hematologic    Bleeding problems:    Problems with blood clotting too easily:        Skin    Rashes  or ulcers:        Constitutional    Fever or chills:     PHYSICAL  EXAM:   Vitals:   09/09/20 1541  BP: (!) 156/74  Pulse: 71  Temp: (!) 97.5 F (36.4 C)  SpO2: 96%  Weight: 240 lb (108.9 kg)  Height: 5\' 9"  (1.753 m)    Constitutional: Well appearing in no distress. Appears well nourished.  Neurologic: Normal gait and station. CN intact.  No weakness.  No sensory loss. Psychiatric: Mood and affect symmetric and appropriate. Eyes: No icterus.  No conjunctival pallor. Ears, nose, throat: mucous membranes moist.  Midline trachea.  Cardiac: regular rate and rhythm.  Respiratory: Unlabored Abdominal: soft, non-tender, non-distended.  Peripheral vascular: 2+ radial pulse on the left Tense focal swelling about the brachio-cephalic anastomosis 2+ brachial pulse on the left Extremity: No edema.  No cyanosis.  No pallor.  Skin: No gangrene.  No ulceration.  Lymphatic: No Stemmer's sign.  No palpable lymphadenopathy.  DATA REVIEW:    Most recent CBC CBC Latest Ref Rng & Units 08/08/2020 04/01/2014 12/31/2013  WBC 3.9 - 10.3 10e3/uL - 8.8 7.6  Hemoglobin 12.0 - 15.0 g/dL 8.5(L) 12.9 13.0  Hematocrit 36.0 - 46.0 % 25.0(L) 38.3 38.1  Platelets 145 - 400 10e3/uL - 247 321     Most recent CMP CMP Latest Ref Rng & Units 08/08/2020 12/31/2013 07/02/2013  Glucose 70 - 99 mg/dL 82 134 112  BUN 8 - 23 mg/dL 51(H) 29.2(H) 27.7(H)  Creatinine 0.44 - 1.00 mg/dL 13.00(H) 1.8(H) 1.6(H)  Sodium 135 - 145 mmol/L 137 145 143  Potassium 3.5 - 5.1 mmol/L 5.1 4.9 4.5  Chloride 98 - 111 mmol/L 110 - -  CO2 22 - 29 mEq/L - 22 24  Calcium 8.4 - 10.4 mg/dL - 10.0 9.9  Total Protein 6.4 - 8.3 g/dL - 7.0 7.3  Total Bilirubin 0.20 - 1.20 mg/dL - 0.25 0.30  Alkaline Phos 40 - 150 U/L - 70 70  AST 5 - 34 U/L - 12 14  ALT 0 - 55 U/L - 12 13     Final report not available for paste into note today. 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   Yevonne Aline. Stanford Breed, MD [facility] Phone  Number: [phone] 09/09/2020 5:45 PM

## 2020-09-09 NOTE — Progress Notes (Signed)
ASSESSMENT & PLAN:  74 y.o. female with CKDV nearing ESRD.  I spent greater than 15 minutes explaining hemodialysis, and the specifics of dialysis access.  I counseled her that all dialysis access requires maintenance.  I explained the rationale for using autogenous tissue when possible.  I counseled her about the risks of steal syndrome.  She understood and wished to proceed with HD access creation.  Plan left upper extremity brachio-basilic AVF creation and repair of brachiocephalic AVF pseudoaneurysm 09/11/20. Because she does not yet need HD I will avoid placing a TDC.  CHIEF COMPLAINT:   CKD5  HISTORY:  HISTORY OF PRESENT ILLNESS: [name] is a 74 y.o. female who presents to clinic for evaluation of dialysis access.  She is a right-handed woman.  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  She is not sure exactly when she will need this but her impression is that this is several months away.  She is an incredibly pleasant woman who has multiple excellent questions about dialysis access today.  09/09/20: Patient presents to clinic for postoperative check. She reports bruising and swelling below her incision occurring shortly after surgery. She does not have a thrill in her AVF.   Past Medical History:  Diagnosis Date   Anemia, iron deficiency    Asthma    as a child   Cancer ([place]) 03/2020   skin cancer - bil legs   Carpal tunnel syndrome    Left hand    CKD (chronic kidney disease)    Depression    DM (diabetes mellitus) ([place])    Type II   Gastritis    H/O: GI bleed 09/2008   Heart murmur    Never was sent to cardiologist   Hemorrhoids, external    History of blood transfusion    bleeding Hemorroids   HTN (hypertension)    Hyperlipidemia    Hypothyroidism    OA (osteoarthritis)    Obesity    Pagophagia    Pneumonia    Sleep apnea, obstructive    Does not use oxygen at bedtime    Vitamin B12 deficiency     Past Surgical  History:  Procedure Laterality Date   ABDOMINAL HYSTERECTOMY     ADENOIDECTOMY     APPENDECTOMY     AV FISTULA PLACEMENT Left 08/08/2020   Procedure: LEFT UPPER EXTREMITY ARTERIOVENOUS (AV) FISTULA CREATION;  Surgeon: Cherre Robins, MD;  Location: [facility] OR;  Service: Vascular;  Laterality: Left;   COLONOSCOPY     ESOPHAGOGASTRODUODENOSCOPY     HEMORRHOID SURGERY     TONSILLECTOMY      Family History  Problem Relation Age of Onset   Hypertension Son    Hemochromatosis Son    Fibromyalgia Daughter     Social History   Socioeconomic History   Marital status: Divorced    Spouse name: Not on file   Number of children: Not on file   Years of education: Not on file   Highest education level: Not on file  Occupational History   Not on file  Tobacco Use   Smoking status: Never Smoker   Smokeless tobacco: Never Used  Vaping Use   Vaping Use: Never used  Substance and Sexual Activity   Alcohol use: No   Drug use: No   Sexual activity: Not on file  Other Topics Concern   Not on file  Social History Narrative   Not on file   Social Determinants of  Health   Financial Resource Strain: Not on file  Food Insecurity: Not on file  Transportation Needs: Not on file  Physical Activity: Not on file  Stress: Not on file  Social Connections: Not on file  Intimate Partner Violence: Not on file    Allergies  Allergen Reactions   Other     Dust and feathers    Current Outpatient Medications  Medication Sig Dispense Refill   acetaminophen (TYLENOL) 650 MG CR tablet Take 1,300 mg by mouth 2 (two) times daily.     amLODipine (NORVASC) 10 MG tablet Take 10 mg by mouth daily.     atorvastatin (LIPITOR) 20 MG tablet Take 20 mg by mouth daily.     betamethasone dipropionate 0.05 % cream Apply 1 application topically 2 (two) times daily as needed (rash).     FLUoxetine (PROZAC) 40 MG capsule Take 40 mg by mouth at bedtime.     Homeopathic Products (LEG  CRAMPS PO) Take 4-6 tablets by mouth daily as needed (leg cramps).     hydrALAZINE (APRESOLINE) 100 MG tablet Take 100 mg by mouth 3 (three) times daily.     levothyroxine (SYNTHROID, LEVOTHROID) 100 MCG tablet Take 100 mcg by mouth daily.     loratadine (CLARITIN) 10 MG tablet Take 10 mg by mouth daily.     MYRBETRIQ 50 MG TB24 tablet Take 50 mg by mouth daily.     oxybutynin (DITROPAN-XL) 10 MG 24 hr tablet Take 10 mg by mouth daily.     oxyCODONE-acetaminophen (PERCOCET) 5-325 MG tablet Take 1 tablet by mouth every 4 (four) hours as needed for severe pain. 6 tablet 0   senna-docusate (SENOKOT-S) 8.6-50 MG tablet Take 8-10 tablets by mouth at bedtime.     vitamin B-12 (CYANOCOBALAMIN) 1000 MCG tablet Take 1,000 mcg by mouth 4 (four) times a week.      No current facility-administered medications for this visit.    REVIEW OF SYSTEMS:  [X]  denotes positive finding, [ ]  denotes negative finding Cardiac  Comments:  Chest pain or chest pressure:    Shortness of breath upon exertion:    Short of breath when lying flat:    Irregular heart rhythm:        Vascular    Pain in calf, thigh, or hip brought on by ambulation:    Pain in feet at night that wakes you up from your sleep:     Blood clot in your veins:    Leg swelling:  x       Pulmonary    Oxygen at home:    Productive cough:     Wheezing:         Neurologic    Sudden weakness in arms or legs:     Sudden numbness in arms or legs:     Sudden onset of difficulty speaking or slurred speech:    Temporary loss of vision in one eye:     Problems with dizziness:         Gastrointestinal    Blood in stool:     Vomited blood:         Genitourinary    Burning when urinating:     Blood in urine:        Psychiatric    Major depression:         Hematologic    Bleeding problems:    Problems with blood clotting too easily:        Skin    Rashes  or ulcers:        Constitutional    Fever or chills:     PHYSICAL  EXAM:   Vitals:   09/09/20 1541  BP: (!) 156/74  Pulse: 71  Temp: (!) 97.5 F (36.4 C)  SpO2: 96%  Weight: 240 lb (108.9 kg)  Height: 5\' 9"  (1.753 m)    Constitutional: Well appearing in no distress. Appears well nourished.  Neurologic: Normal gait and station. CN intact.  No weakness.  No sensory loss. Psychiatric: Mood and affect symmetric and appropriate. Eyes: No icterus.  No conjunctival pallor. Ears, nose, throat: mucous membranes moist.  Midline trachea.  Cardiac: regular rate and rhythm.  Respiratory: Unlabored Abdominal: soft, non-tender, non-distended.  Peripheral vascular: 2+ radial pulse on the left Tense focal swelling about the brachio-cephalic anastomosis 2+ brachial pulse on the left Extremity: No edema.  No cyanosis.  No pallor.  Skin: No gangrene.  No ulceration.  Lymphatic: No Stemmer's sign.  No palpable lymphadenopathy.  DATA REVIEW:    Most recent CBC CBC Latest Ref Rng & Units 08/08/2020 04/01/2014 12/31/2013  WBC 3.9 - 10.3 10e3/uL - 8.8 7.6  Hemoglobin 12.0 - 15.0 g/dL 8.5(L) 12.9 13.0  Hematocrit 36.0 - 46.0 % 25.0(L) 38.3 38.1  Platelets 145 - 400 10e3/uL - 247 321     Most recent CMP CMP Latest Ref Rng & Units 08/08/2020 12/31/2013 07/02/2013  Glucose 70 - 99 mg/dL 82 134 112  BUN 8 - 23 mg/dL 51(H) 29.2(H) 27.7(H)  Creatinine 0.44 - 1.00 mg/dL 13.00(H) 1.8(H) 1.6(H)  Sodium 135 - 145 mmol/L 137 145 143  Potassium 3.5 - 5.1 mmol/L 5.1 4.9 4.5  Chloride 98 - 111 mmol/L 110 - -  CO2 22 - 29 mEq/L - 22 24  Calcium 8.4 - 10.4 mg/dL - 10.0 9.9  Total Protein 6.4 - 8.3 g/dL - 7.0 7.3  Total Bilirubin 0.20 - 1.20 mg/dL - 0.25 0.30  Alkaline Phos 40 - 150 U/L - 70 70  AST 5 - 34 U/L - 12 14  ALT 0 - 55 U/L - 12 13     Final report not available for paste into note today. 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   Yevonne Aline. Stanford Breed, MD [facility] Phone  Number: [phone] 09/09/2020 5:45 PM

## 2020-09-10 ENCOUNTER — Other Ambulatory Visit (HOSPITAL_COMMUNITY)
Admission: RE | Admit: 2020-09-10 | Discharge: 2020-09-10 | Disposition: A | Discharge status: Home / Self Care | Payer: Medicare Other | Source: Ambulatory Visit | Attending: Vascular Surgery | Admitting: Vascular Surgery

## 2020-09-10 ENCOUNTER — Encounter (HOSPITAL_COMMUNITY): Payer: Self-pay

## 2020-09-10 DIAGNOSIS — Z20822 Contact with and (suspected) exposure to covid-19: Secondary | ICD-10-CM | POA: Diagnosis not present

## 2020-09-10 DIAGNOSIS — Z01812 Encounter for preprocedural laboratory examination: Secondary | ICD-10-CM | POA: Insufficient documentation

## 2020-09-10 LAB — SARS CORONAVIRUS 2 (TAT 6-24 HRS): SARS Coronavirus 2: NEGATIVE

## 2020-09-11 ENCOUNTER — Ambulatory Visit (HOSPITAL_COMMUNITY): Payer: Medicare Other | Admitting: Certified Registered"

## 2020-09-11 ENCOUNTER — Other Ambulatory Visit: Payer: Self-pay

## 2020-09-11 ENCOUNTER — Encounter (HOSPITAL_COMMUNITY)
Admission: RE | Disposition: A | Discharge status: Home / Self Care | Payer: Self-pay | Source: Home / Self Care | Attending: Vascular Surgery

## 2020-09-11 ENCOUNTER — Ambulatory Visit (HOSPITAL_COMMUNITY)
Admission: RE | Admit: 2020-09-11 | Discharge: 2020-09-11 | Disposition: A | Discharge status: Home / Self Care | Payer: Medicare Other | Attending: Vascular Surgery | Admitting: Vascular Surgery

## 2020-09-11 ENCOUNTER — Encounter (HOSPITAL_COMMUNITY): Payer: Self-pay

## 2020-09-11 DIAGNOSIS — N186 End stage renal disease: Secondary | ICD-10-CM | POA: Insufficient documentation

## 2020-09-11 DIAGNOSIS — N185 Chronic kidney disease, stage 5: Secondary | ICD-10-CM | POA: Diagnosis not present

## 2020-09-11 DIAGNOSIS — Z79899 Other long term (current) drug therapy: Secondary | ICD-10-CM | POA: Insufficient documentation

## 2020-09-11 DIAGNOSIS — Z79890 Hormone replacement therapy (postmenopausal): Secondary | ICD-10-CM | POA: Diagnosis not present

## 2020-09-11 HISTORY — PX: AV FISTULA PLACEMENT: SHX1204

## 2020-09-11 HISTORY — PX: HEMATOMA EVACUATION: SHX5118

## 2020-09-11 LAB — POCT I-STAT, CHEM 8
BUN: 37 mg/dL — ABNORMAL HIGH (ref 8–23)
Calcium, Ion: 1.22 mmol/L (ref 1.15–1.40)
Chloride: 117 mmol/L — ABNORMAL HIGH (ref 98–111)
Creatinine, Ser: 3.3 mg/dL — ABNORMAL HIGH (ref 0.44–1.00)
Glucose, Bld: 141 mg/dL — ABNORMAL HIGH (ref 70–99)
HCT: 32 % — ABNORMAL LOW (ref 36.0–46.0)
Hemoglobin: 10.9 g/dL — ABNORMAL LOW (ref 12.0–15.0)
Potassium: 3.4 mmol/L — ABNORMAL LOW (ref 3.5–5.1)
Sodium: 145 mmol/L (ref 135–145)
TCO2: 17 mmol/L — ABNORMAL LOW (ref 22–32)

## 2020-09-11 LAB — GLUCOSE, CAPILLARY
Glucose-Capillary: 137 mg/dL — ABNORMAL HIGH (ref 70–99)
Glucose-Capillary: 122 mg/dL — ABNORMAL HIGH (ref 70–99)

## 2020-09-11 SURGERY — ARTERIOVENOUS (AV) FISTULA CREATION
Anesthesia: General | Site: Arm Upper | Laterality: Left

## 2020-09-11 MED ORDER — 0.9 % SODIUM CHLORIDE (POUR BTL) OPTIME
TOPICAL | Status: DC | PRN
  Administered 2020-09-11: 10:00:00 1000 mL

## 2020-09-11 MED ORDER — PROPOFOL 10 MG/ML IV BOLUS
INTRAVENOUS | Status: DC | PRN
  Administered 2020-09-11: 150 mg via INTRAVENOUS

## 2020-09-11 MED ORDER — CEFAZOLIN SODIUM-DEXTROSE 2-4 GM/100ML-% IV SOLN
2.0000 g | INTRAVENOUS | Status: AC
  Administered 2020-09-11: 10:00:00 2 g via INTRAVENOUS
  Filled 2020-09-11: qty 100

## 2020-09-11 MED ORDER — LIDOCAINE 2% (20 MG/ML) 5 ML SYRINGE
INTRAMUSCULAR | Status: DC | PRN
  Administered 2020-09-11: 60 mg via INTRAVENOUS

## 2020-09-11 MED ORDER — PAPAVERINE HCL 30 MG/ML IJ SOLN
INTRAMUSCULAR | Status: AC
  Filled 2020-09-11: qty 2

## 2020-09-11 MED ORDER — SODIUM CHLORIDE 0.9 % IV SOLN: INTRAVENOUS | Status: DC

## 2020-09-11 MED ORDER — HEPARIN SODIUM (PORCINE) 1000 UNIT/ML IJ SOLN
INTRAMUSCULAR | Status: DC | PRN
  Administered 2020-09-11: 5000 [IU] via INTRAVENOUS

## 2020-09-11 MED ORDER — LIDOCAINE-EPINEPHRINE (PF) 1 %-1:200000 IJ SOLN
INTRAMUSCULAR | Status: DC | PRN
  Administered 2020-09-11: 6 mL

## 2020-09-11 MED ORDER — MIDAZOLAM HCL 2 MG/2ML IJ SOLN
INTRAMUSCULAR | Status: AC
  Filled 2020-09-11: qty 2

## 2020-09-11 MED ORDER — DEXAMETHASONE SODIUM PHOSPHATE 10 MG/ML IJ SOLN
INTRAMUSCULAR | Status: DC | PRN
  Administered 2020-09-11: 5 mg via INTRAVENOUS

## 2020-09-11 MED ORDER — LIDOCAINE-EPINEPHRINE (PF) 1 %-1:200000 IJ SOLN
INTRAMUSCULAR | Status: AC
  Filled 2020-09-11: qty 30

## 2020-09-11 MED ORDER — OXYCODONE-ACETAMINOPHEN 5-325 MG PO TABS: 1.0000 | ORAL_TABLET | ORAL | 0 refills | Status: DC | PRN

## 2020-09-11 MED ORDER — FENTANYL CITRATE (PF) 250 MCG/5ML IJ SOLN
INTRAMUSCULAR | Status: DC | PRN
  Administered 2020-09-11: 25 ug via INTRAVENOUS
  Administered 2020-09-11: 75 ug via INTRAVENOUS

## 2020-09-11 MED ORDER — ORAL CARE MOUTH RINSE: 15.0000 mL | Freq: Once | OROMUCOSAL | Status: AC

## 2020-09-11 MED ORDER — PHENYLEPHRINE HCL-NACL 10-0.9 MG/250ML-% IV SOLN
INTRAVENOUS | Status: DC | PRN
  Administered 2020-09-11: 30 ug/min via INTRAVENOUS

## 2020-09-11 MED ORDER — CHLORHEXIDINE GLUCONATE 4 % EX LIQD: 60.0000 mL | Freq: Once | CUTANEOUS | Status: DC

## 2020-09-11 MED ORDER — SODIUM CHLORIDE 0.9 % IV SOLN
INTRAVENOUS | Status: DC | PRN
  Administered 2020-09-11: 10:00:00 500 mL

## 2020-09-11 MED ORDER — FENTANYL CITRATE (PF) 100 MCG/2ML IJ SOLN: 25.0000 ug | INTRAMUSCULAR | Status: DC | PRN

## 2020-09-11 MED ORDER — PROTAMINE SULFATE 10 MG/ML IV SOLN
INTRAVENOUS | Status: DC | PRN
  Administered 2020-09-11: 30 mg via INTRAVENOUS

## 2020-09-11 MED ORDER — FENTANYL CITRATE (PF) 250 MCG/5ML IJ SOLN
INTRAMUSCULAR | Status: AC
  Filled 2020-09-11: qty 5

## 2020-09-11 MED ORDER — ONDANSETRON HCL 4 MG/2ML IJ SOLN
INTRAMUSCULAR | Status: DC | PRN
  Administered 2020-09-11: 4 mg via INTRAVENOUS

## 2020-09-11 MED ORDER — EPHEDRINE SULFATE 50 MG/ML IJ SOLN
INTRAMUSCULAR | Status: DC | PRN
  Administered 2020-09-11 (×2): 10 mg via INTRAVENOUS

## 2020-09-11 MED ORDER — SODIUM CHLORIDE 0.9 % IV SOLN
INTRAVENOUS | Status: AC
  Filled 2020-09-11: qty 1.2

## 2020-09-11 MED ORDER — CHLORHEXIDINE GLUCONATE 0.12 % MT SOLN
15.0000 mL | Freq: Once | OROMUCOSAL | Status: AC
  Administered 2020-09-11: 08:00:00 15 mL via OROMUCOSAL
  Filled 2020-09-11: qty 15

## 2020-09-11 SURGICAL SUPPLY — 43 items
ADH SKN CLS APL DERMABOND .7 (GAUZE/BANDAGES/DRESSINGS)
APL PRP STRL LF DISP 70% ISPRP (MISCELLANEOUS) ×1
APL SKNCLS STERI-STRIP NONHPOA (GAUZE/BANDAGES/DRESSINGS) ×1
ARMBAND PINK RESTRICT EXTREMIT (MISCELLANEOUS) ×3 IMPLANT
BENZOIN TINCTURE PRP APPL 2/3 (GAUZE/BANDAGES/DRESSINGS) ×3 IMPLANT
BNDG ELASTIC 4X5.8 VLCR STR LF (GAUZE/BANDAGES/DRESSINGS) ×2 IMPLANT
BNDG GAUZE ELAST 4 BULKY (GAUZE/BANDAGES/DRESSINGS) ×2 IMPLANT
CANISTER SUCT 3000ML PPV (MISCELLANEOUS) ×3 IMPLANT
CANNULA VESSEL 3MM 2 BLNT TIP (CANNULA) ×3 IMPLANT
CHLORAPREP W/TINT 26 (MISCELLANEOUS) ×3 IMPLANT
CLIP VESOCCLUDE MED 6/CT (CLIP) ×3 IMPLANT
CLIP VESOCCLUDE SM WIDE 6/CT (CLIP) ×3 IMPLANT
CLOSURE WOUND 1/2 X4 (GAUZE/BANDAGES/DRESSINGS) ×1
COVER PROBE W GEL 5X96 (DRAPES) ×2 IMPLANT
COVER WAND RF STERILE (DRAPES) ×1 IMPLANT
DERMABOND ADVANCED (GAUZE/BANDAGES/DRESSINGS)
DERMABOND ADVANCED .7 DNX12 (GAUZE/BANDAGES/DRESSINGS) IMPLANT
DRAPE EXTREMITY T 121X128X90 (DISPOSABLE) ×3 IMPLANT
ELECT REM PT RETURN 9FT ADLT (ELECTROSURGICAL) ×3
ELECTRODE REM PT RTRN 9FT ADLT (ELECTROSURGICAL) ×1 IMPLANT
GAUZE SPONGE 4X4 12PLY STRL (GAUZE/BANDAGES/DRESSINGS) ×2 IMPLANT
GLOVE SURG SS PI 8.0 STRL IVOR (GLOVE) ×3 IMPLANT
GOWN STRL REUS W/ TWL LRG LVL3 (GOWN DISPOSABLE) ×2 IMPLANT
GOWN STRL REUS W/ TWL XL LVL3 (GOWN DISPOSABLE) ×1 IMPLANT
GOWN STRL REUS W/TWL LRG LVL3 (GOWN DISPOSABLE) ×6
GOWN STRL REUS W/TWL XL LVL3 (GOWN DISPOSABLE) ×3
INSERT FOGARTY SM (MISCELLANEOUS) IMPLANT
KIT BASIN OR (CUSTOM PROCEDURE TRAY) ×3 IMPLANT
KIT TURNOVER KIT B (KITS) ×3 IMPLANT
NS IRRIG 1000ML POUR BTL (IV SOLUTION) ×3 IMPLANT
PACK CV ACCESS (CUSTOM PROCEDURE TRAY) ×3 IMPLANT
PAD ARMBOARD 7.5X6 YLW CONV (MISCELLANEOUS) ×6 IMPLANT
PENCIL SMOKE EVACUATOR (MISCELLANEOUS) ×3 IMPLANT
STRIP CLOSURE SKIN 1/2X4 (GAUZE/BANDAGES/DRESSINGS) ×2 IMPLANT
SUT MNCRL AB 4-0 PS2 18 (SUTURE) ×3 IMPLANT
SUT PROLENE 6 0 BV (SUTURE) ×3 IMPLANT
SUT PROLENE 6 0 CC (SUTURE) ×2 IMPLANT
SUT VIC AB 3-0 SH 27 (SUTURE) ×3
SUT VIC AB 3-0 SH 27X BRD (SUTURE) ×1 IMPLANT
SYR CONTROL 10ML LL (SYRINGE) ×2 IMPLANT
TOWEL GREEN STERILE (TOWEL DISPOSABLE) ×3 IMPLANT
UNDERPAD 30X36 HEAVY ABSORB (UNDERPADS AND DIAPERS) ×3 IMPLANT
WATER STERILE IRR 1000ML POUR (IV SOLUTION) ×3 IMPLANT

## 2020-09-11 NOTE — Anesthesia Preprocedure Evaluation (Signed)
Anesthesia Evaluation  Patient identified by MRN, date of birth, ID band Patient awake    Reviewed: Allergy & Precautions, NPO status , Patient's Chart, lab work & pertinent test results  Airway Mallampati: II  TM Distance: >3 FB     Dental   Pulmonary asthma , sleep apnea , pneumonia,    breath sounds clear to auscultation       Cardiovascular hypertension,  Rhythm:Regular Rate:Normal     Neuro/Psych PSYCHIATRIC DISORDERS Anxiety Depression  Neuromuscular disease    GI/Hepatic negative GI ROS,   Endo/Other  diabetesHypothyroidism   Renal/GU Renal disease     Musculoskeletal   Abdominal   Peds  Hematology  (+) anemia ,   Anesthesia Other Findings   Reproductive/Obstetrics                             Anesthesia Physical Anesthesia Plan  ASA: III  Anesthesia Plan: General   Post-op Pain Management:    Induction:   PONV Risk Score and Plan: 3 and Ondansetron, Dexamethasone and Midazolam  Airway Management Planned: LMA  Additional Equipment:   Intra-op Plan:   Post-operative Plan: Extubation in OR  Informed Consent: I have reviewed the patients History and Physical, chart, labs and discussed the procedure including the risks, benefits and alternatives for the proposed anesthesia with the patient or authorized representative who has indicated his/her understanding and acceptance.     Dental advisory given  Plan Discussed with: CRNA and Anesthesiologist  Anesthesia Plan Comments:         Anesthesia Quick Evaluation

## 2020-09-11 NOTE — Anesthesia Procedure Notes (Signed)
Procedure Name: LMA Insertion Date/Time: 09/11/2020 9:28 AM Performed by: Imagene Riches, CRNA Pre-anesthesia Checklist: Patient identified, Emergency Drugs available, Suction available and Patient being monitored Patient Re-evaluated:Patient Re-evaluated prior to induction Oxygen Delivery Method: Circle System Utilized Preoxygenation: Pre-oxygenation with 100% oxygen Induction Type: IV induction Ventilation: Mask ventilation without difficulty LMA: LMA inserted LMA Size: 4.0 Number of attempts: 1 Airway Equipment and Method: Bite block Placement Confirmation: positive ETCO2 Tube secured with: Tape Dental Injury: Teeth and Oropharynx as per pre-operative assessment

## 2020-09-11 NOTE — Discharge Instructions (Signed)
[facility]  Discharge Instructions  AV Fistula or Graft Surgery for Dialysis Access  Please refer to the following instructions for your post-procedure care. Your surgeon or physician assistant will discuss any changes with you.  Activity  You may drive the day following your surgery, if you are comfortable and no longer taking prescription pain medication. Resume full activity as the soreness in your incision resolves.  Bathing/Showering  You may shower after you go home. Keep your incision dry for 48 hours. Do not soak in a bathtub, hot tub, or swim until the incision heals completely. You may not shower if you have a hemodialysis catheter.  Incision Care  Clean your incision with mild soap and water after 48 hours. Pat the area dry with a clean towel. You do not need a bandage unless otherwise instructed. Do not apply any ointments or creams to your incision. You may have skin glue on your incision or steri strips. Do not peel it off. It will come off on its own in about one week. Your arm may swell a bit after surgery. To reduce swelling use pillows to elevate your arm so it is above your heart. Your doctor will tell you if you need to lightly wrap your arm with an ACE bandage.  Diet  Resume your normal diet. There are not special food restrictions following this procedure. In order to heal from your surgery, it is CRITICAL to get adequate nutrition. Your body requires vitamins, minerals, and protein. Vegetables are the best source of vitamins and minerals. Vegetables also provide the perfect balance of protein. Processed food has little nutritional value, so try to avoid this.  Medications  Resume taking all of your medications. If your incision is causing pain, you may take over-the counter pain relievers such as acetaminophen (Tylenol). If you were prescribed a stronger pain medication, please be aware these medications can cause nausea and  constipation. Prevent nausea by taking the medication with a snack or meal. Avoid constipation by drinking plenty of fluids and eating foods with high amount of fiber, such as fruits, vegetables, and grains.   Do not take Tylenol if you are taking prescription pain medications.  Follow up Your surgeon may want to see you in the office following your access surgery. If so, this will be arranged at the time of your surgery.  Please call us immediately for any of the following conditions:  . Increased pain, redness, drainage (pus) from your incision site . Fever of 101 degrees or higher . Severe or worsening pain at your incision site . Hand pain or numbness. .  Reduce your risk of vascular disease:  . Stop smoking. If you would like help, call [facility] at 1-800-QUIT-NOW 719-407-3993) or [place] at (279)341-5412  . Manage your cholesterol . Maintain a desired weight . Control your diabetes . Keep your blood pressure down  Dialysis  It will take several weeks to several months for your new dialysis access to be ready for use. Your surgeon will determine when it is okay to use it. Your nephrologist will continue to direct your dialysis. You can continue to use your Permcath until your new access is ready for use.   09/11/2020 KRISALYN YANKOWSKI [id number] 1946/01/25  Surgeon(s): Cherre Robins, MD  Procedure(s): LEFT UPPER EXTREMITY BRACHIOCEPHALIC FISTULA  CREATION EVACUATION HEMATOMA   May stick graft immediately   May stick graft on designated area only:   X Do not stick left  AV fistula for 12 weeks    If you have any questions, please call the office at [phone].

## 2020-09-11 NOTE — Op Note (Signed)
DATE OF SERVICE: 09/11/2020  PATIENT:  [name]  74 y.o. female  PRE-OPERATIVE DIAGNOSIS:  ESRD  POST-OPERATIVE DIAGNOSIS:  end-stage renal disease  PROCEDURE: Left upper extremity brachio-basilic arteriovenous fistula creation Left antecubital fossa hematoma evacuation  SURGEON:  Surgeon(s) and Role:    * Cherre Robins, MD - Primary  ASSISTANT: Paulo Fruit, PA-C  An assistant was required to facilitate exposure and expedite the case.  ANESTHESIA:   local and general  EBL: min  BLOOD ADMINISTERED:none  DRAINS: none   LOCAL MEDICATIONS USED:  LIDOCAINE   SPECIMEN:  none  COUNTS: confirmed correct.  TOURNIQUET:  * No tourniquets in log *  PATIENT DISPOSITION:  PACU - hemodynamically stable.   Delay start of Pharmacological VTE agent (>24hrs) due to surgical blood loss or risk of bleeding: no  INDICATION FOR PROCEDURE: [name] is a 74 y.o. female with CKDIV nearing ESRD. She underwent a brachiocephalic AVF complicated by a possible pseudoaneurysm postoperatively. After careful discussion of risks, benefits, and alternatives the patient was offered creation of new access and pseduoaneurysm repair. We specifically discussed risk of steal syndrome. The patient understood and wished to proceed.  OPERATIVE FINDINGS: hematoma in left antecubital fossa - no evidence of pseudoaneurysm. Unremarkable brahcio-basilic AVF.   DESCRIPTION OF PROCEDURE: After identification of the patient in the pre-operative holding area, the patient was transferred to the operating room. The patient was positioned supine on the operating room table. Anesthesia was induced. The left arm was prepped and draped in standard fashion. A surgical pause was performed confirming correct patient, procedure, and operative location.  To begin an oblique incision over the distal arm just proximal to the antecubital fossa was made. The incision was carried down through the subcutaneous tissue until  of the basilic vein was identified.  The basilic vein was skeletonized prepped like the incision.  We continued our exposure and divided the brachial sheath sharply to encounter the brachial artery.  This was exposed and encircled proximally distally with Silastic Vesseloops.  The previous incision used for the creation of the brachiocephalic AV fistula was reopened.  Tense hematoma was encountered.  No active bleeding was noted.  We irrigated the wound copiously.  We left the wound open until the end of the case to allow for any bleeding to declare itself before leaving the operating room.  The basilic vein was transected distally.  The distal stump was oversewn with a 2-0 silk suture.  The basilic vein was spatulated.  A Serafin clamp was applied to the proximal basilic vein.  Patient was heparinized with 5000 units of heparin.  Brachial artery was clamped proximally distally.  An anterior arteriotomy was made with 11 blade and extended with Potts scissors.  An end-to-side anastomosis between the basilic vein and brachial artery was created using a continuous suture of 6-0 Prolene.  Immediately prior to completion the anastomosis was flushed and de-aired.  The anastomosis was completed.  Hemostasis was assured.  After completion none palpable thrill was noted in the proximal fistula.  Doppler flow was noted in the radial artery upon completion.  This did not augment with compression of the fistula.  The wounds were copiously irrigated with saline.  The wounds were closed in layers using 3-0 Vicryl and 4-0 Monocryl.  Upon completion of the case instrument and sharps counts were confirmed correct. The patient was transferred to the PACU in good condition. I was present for all portions of the procedure.  [name]. Stanford Breed, MD  [facility] Phone Number: [phone] 09/11/2020 10:51 AM

## 2020-09-11 NOTE — Interval H&P Note (Signed)
History and Physical Interval Note:  09/11/2020 7:54 AM  [name]  has presented today for surgery, with the diagnosis of ESRD.  The various methods of treatment have been discussed with the patient and family. After consideration of risks, benefits and other options for treatment, the patient has consented to  Procedure(s): LEFT UPPER EXTREMITY FISTULA REVISION AND PSEUDOANEURYSM REPAIR (Left) as a surgical intervention.  The patient's history has been reviewed, patient examined, no change in status, stable for surgery.  I have reviewed the patient's chart and labs.  Questions were answered to the patient's satisfaction.     Cherre Robins

## 2020-09-11 NOTE — Anesthesia Postprocedure Evaluation (Signed)
Anesthesia Post Note  Patient: [name]  Procedure(s) Performed: LEFT UPPER EXTREMITY BRACHIOCEPHALIC FISTULA  CREATION (Left Arm Upper) EVACUATION HEMATOMA (Left Arm Upper)     Patient location during evaluation: PACU Anesthesia Type: General Pain management: pain level controlled Vital Signs Assessment: post-procedure vital signs reviewed and stable Respiratory status: spontaneous breathing Cardiovascular status: stable Postop Assessment: no apparent nausea or vomiting Anesthetic complications: no   No complications documented.  Last Vitals:  Vitals:   09/11/20 1117 09/11/20 1132  BP: (!) 153/60 (!) 160/63  Pulse: 69 68  Resp: (!) 9 16  Temp:  36.8 C  SpO2: 90% 95%    Last Pain:  Vitals:   09/11/20 1132  TempSrc:   PainSc: 0-No pain                 Simmie Camerer

## 2020-09-11 NOTE — Transfer of Care (Signed)
Immediate Anesthesia Transfer of Care Note  Patient: [name]  Procedure(s) Performed: LEFT UPPER EXTREMITY BRACHIOCEPHALIC FISTULA  CREATION (Left Arm Upper) EVACUATION HEMATOMA (Left Arm Upper)  Patient Location: PACU  Anesthesia Type:General  Level of Consciousness: drowsy  Airway & Oxygen Therapy: Patient Spontanous Breathing and Patient connected to nasal cannula oxygen  Post-op Assessment: Report given to RN and Post -op Vital signs reviewed and stable  Post vital signs: Reviewed and stable  Last Vitals:  Vitals Value Taken Time  BP 161/65 09/11/20 1102  Temp    Pulse 72 09/11/20 1106  Resp 9 09/11/20 1106  SpO2 95 % 09/11/20 1106  Vitals shown include unvalidated device data.  Last Pain:  Vitals:   09/11/20 0715  TempSrc: Oral         Complications: No complications documented.

## 2020-09-12 ENCOUNTER — Encounter (HOSPITAL_COMMUNITY): Payer: Self-pay | Admitting: Vascular Surgery

## 2020-09-16 ENCOUNTER — Encounter (HOSPITAL_COMMUNITY): Payer: Medicare Other

## 2020-09-25 ENCOUNTER — Other Ambulatory Visit: Payer: Self-pay

## 2020-09-25 DIAGNOSIS — M170 Bilateral primary osteoarthritis of knee: Secondary | ICD-10-CM | POA: Insufficient documentation

## 2020-09-25 DIAGNOSIS — N3281 Overactive bladder: Secondary | ICD-10-CM | POA: Insufficient documentation

## 2020-09-25 DIAGNOSIS — I739 Peripheral vascular disease, unspecified: Secondary | ICD-10-CM | POA: Insufficient documentation

## 2020-09-25 DIAGNOSIS — E7800 Pure hypercholesterolemia, unspecified: Secondary | ICD-10-CM | POA: Insufficient documentation

## 2020-09-25 DIAGNOSIS — G4700 Insomnia, unspecified: Secondary | ICD-10-CM | POA: Insufficient documentation

## 2020-09-25 DIAGNOSIS — G8929 Other chronic pain: Secondary | ICD-10-CM | POA: Insufficient documentation

## 2020-09-25 DIAGNOSIS — E789 Disorder of lipoprotein metabolism, unspecified: Secondary | ICD-10-CM | POA: Insufficient documentation

## 2020-09-25 DIAGNOSIS — E1121 Type 2 diabetes mellitus with diabetic nephropathy: Secondary | ICD-10-CM | POA: Insufficient documentation

## 2020-09-25 DIAGNOSIS — M545 Low back pain, unspecified: Secondary | ICD-10-CM | POA: Insufficient documentation

## 2020-09-25 DIAGNOSIS — Z0000 Encounter for general adult medical examination without abnormal findings: Secondary | ICD-10-CM | POA: Insufficient documentation

## 2020-09-25 DIAGNOSIS — M109 Gout, unspecified: Secondary | ICD-10-CM | POA: Insufficient documentation

## 2020-09-25 DIAGNOSIS — N185 Chronic kidney disease, stage 5: Secondary | ICD-10-CM

## 2020-10-14 ENCOUNTER — Encounter (HOSPITAL_COMMUNITY): Payer: Medicare Other

## 2020-10-28 ENCOUNTER — Encounter: Payer: Self-pay | Admitting: Vascular Surgery

## 2020-10-28 ENCOUNTER — Other Ambulatory Visit: Payer: Self-pay

## 2020-10-28 ENCOUNTER — Ambulatory Visit (HOSPITAL_COMMUNITY)
Admission: RE | Admit: 2020-10-28 | Discharge: 2020-10-28 | Disposition: A | Discharge status: Home / Self Care | Payer: Medicare Other | Source: Ambulatory Visit | Attending: Vascular Surgery | Admitting: Vascular Surgery

## 2020-10-28 ENCOUNTER — Ambulatory Visit (INDEPENDENT_AMBULATORY_CARE_PROVIDER_SITE_OTHER): Payer: Self-pay | Admitting: Vascular Surgery

## 2020-10-28 VITALS — BP 169/73 | HR 67 | Temp 98.1°F | Resp 20 | Ht 69.0 in | Wt 232.0 lb

## 2020-10-28 DIAGNOSIS — N185 Chronic kidney disease, stage 5: Secondary | ICD-10-CM | POA: Diagnosis present

## 2020-10-28 NOTE — Progress Notes (Signed)
ASSESSMENT & PLAN:  75 y.o. female with CKDV nearing ESRD.  I spent greater than 15 minutes explaining hemodialysis, and the specifics of dialysis access.  I counseled her that all dialysis access requires maintenance.  I explained the rationale for using autogenous tissue when possible.  I counseled her about the risks of steal syndrome.  She understood and wished to proceed with HD access creation.  Status post left brachio-basilic arteriovenous fistula creation 09/11/20. This needs to be transposed. I offered her next available, but she prefers 10/31/20.  CHIEF COMPLAINT:   CKD5  HISTORY:  HISTORY OF PRESENT ILLNESS: [name] is a 75 y.o. female who presents to clinic for evaluation of dialysis access.  She is a right-handed woman.  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  She is not sure exactly when she will need this but her impression is that this is several months away.  She is an incredibly pleasant woman who has multiple excellent questions about dialysis access today.  09/09/20: Patient presents to clinic for postoperative check. She reports bruising and swelling below her incision occurring shortly after surgery. She does not have a thrill in her AVF.   10/28/20: Returns for post op evaluation. She is very close to starting dialysis. She is quite symptomatic from uremia and reports daily nausea and morning emesis.   Past Medical History:  Diagnosis Date  . Anemia, iron deficiency   . Asthma    as a child  . Cancer ([place]) 03/2020   skin cancer - bil legs  . Carpal tunnel syndrome    Left hand   . CKD (chronic kidney disease)   . Depression   . 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 Gastritis   . H/O: GI bleed 09/2008  . Heart murmur    Never was sent to cardiologist  . Hemorrhoids, external   . History of blood transfusion    bleeding Hemorroids  . HTN (hypertension)   . Hyperlipidemia   . Hypothyroidism   . OA (osteoarthritis)   . Obesity    . Pagophagia   . Pneumonia   . Sleep apnea, obstructive    Does not use oxygen at bedtime   . Vitamin B12 deficiency     Past Surgical History:  Procedure Laterality Date  . ABDOMINAL HYSTERECTOMY    . ADENOIDECTOMY    . APPENDECTOMY    . AV FISTULA PLACEMENT Left 08/08/2020   Procedure: LEFT UPPER EXTREMITY ARTERIOVENOUS (AV) FISTULA CREATION;  Surgeon: Cherre Robins, MD;  Location: [place];  Service: Vascular;  Laterality: Left;  . AV FISTULA PLACEMENT Left 09/11/2020   Procedure: LEFT UPPER EXTREMITY BRACHIOCEPHALIC FISTULA  CREATION;  Surgeon: Cherre Robins, MD;  Location: [place];  Service: Vascular;  Laterality: Left;  . COLONOSCOPY    . ESOPHAGOGASTRODUODENOSCOPY    . HEMATOMA EVACUATION Left 09/11/2020   Procedure: EVACUATION HEMATOMA;  Surgeon: Cherre Robins, MD;  Location: [place];  Service: Vascular;  Laterality: Left;  . HEMORRHOID SURGERY    . TONSILLECTOMY      Family History  Problem Relation Age of Onset  . Hypertension Son   . Hemochromatosis Son   . Fibromyalgia Daughter     Social History   Socioeconomic History  . Marital status: Divorced    Spouse name: Not on file  . Number of children: Not on file  . Years of education: Not on file  .  Highest education level: Not on file  Occupational History  . Not on file  Tobacco Use  . Smoking status: Never Smoker  . Smokeless tobacco: Never Used  Vaping Use  . Vaping Use: Never used  Substance and Sexual Activity  . Alcohol use: No  . Drug use: No  . Sexual activity: Not on file  Other Topics Concern  . Not on file  Social History Narrative  . Not on file   Social Determinants of Health   Financial Resource Strain: Not on file  Food Insecurity: Not on file  Transportation Needs: Not on file  Physical Activity: Not on file  Stress: Not on file  Social Connections: Not on file  Intimate Partner Violence: Not on file    Allergies  Allergen Reactions  . Tape Anaphylaxis    Adhesive tape  causes blisters  . Other Other (See Comments)    Dust and feathers/asthma    Current Outpatient Medications  Medication Sig Dispense Refill  . acetaminophen (TYLENOL) 650 MG CR tablet Take 1,300 mg by mouth 2 (two) times daily.    Marland Kitchen amLODipine (NORVASC) 10 MG tablet Take 10 mg by mouth daily.    Marland Kitchen atorvastatin (LIPITOR) 20 MG tablet Take 20 mg by mouth daily.    . betamethasone dipropionate 0.05 % cream Apply 1 application topically 2 (two) times daily as needed (rash).    . diphenhydrAMINE (BENADRYL) 25 MG tablet Take 25 mg by mouth every 6 (six) hours as needed for allergies.    Marland Kitchen FLUoxetine (PROZAC) 40 MG capsule Take 40 mg by mouth at bedtime.    . Homeopathic Products (LEG CRAMPS PO) Take 4-6 tablets by mouth daily as needed (leg cramps).    . hydrALAZINE (APRESOLINE) 100 MG tablet Take 100 mg by mouth 3 (three) times daily.    Marland Kitchen levothyroxine (SYNTHROID, LEVOTHROID) 100 MCG tablet Take 100 mcg by mouth daily.    . Multiple Vitamins-Minerals (AIRBORNE GUMMIES) CHEW Chew 2 tablets by mouth daily.    Marland Kitchen MYRBETRIQ 50 MG TB24 tablet Take 50 mg by mouth daily.    Marland Kitchen oxybutynin (DITROPAN-XL) 10 MG 24 hr tablet Take 10 mg by mouth daily.    Marland Kitchen senna-docusate (SENOKOT-S) 8.6-50 MG tablet Take 8-10 tablets by mouth at bedtime.    . vitamin B-12 (CYANOCOBALAMIN) 1000 MCG tablet Take 1,000 mcg by mouth 4 (four) times a week.      No current facility-administered medications for this visit.    REVIEW OF SYSTEMS:  [X]  denotes positive finding, [ ]  denotes negative finding Cardiac  Comments:  Chest pain or chest pressure:    Shortness of breath upon exertion:    Short of breath when lying flat:    Irregular heart rhythm:        Vascular    Pain in calf, thigh, or hip brought on by ambulation:    Pain in feet at night that wakes you up from your sleep:     Blood clot in your veins:    Leg swelling:  x       Pulmonary    Oxygen at home:    Productive cough:     Wheezing:          Neurologic    Sudden weakness in arms or legs:     Sudden numbness in arms or legs:     Sudden onset of difficulty speaking or slurred speech:    Temporary loss of vision in one eye:  Problems with dizziness:         Gastrointestinal    Blood in stool:     Vomited blood:         Genitourinary    Burning when urinating:     Blood in urine:        Psychiatric    Major depression:         Hematologic    Bleeding problems:    Problems with blood clotting too easily:        Skin    Rashes or ulcers:        Constitutional    Fever or chills:     PHYSICAL EXAM:   Vitals:   10/28/20 1533  BP: (!) 169/73  Pulse: 67  Resp: 20  Temp: 98.1 F (36.7 C)  SpO2: 95%  Weight: 232 lb (105.2 kg)  Height: 5\' 9"  (1.753 m)    Constitutional: Well appearing in no distress. Appears well nourished.  Neurologic: Normal gait and station. CN intact.  No weakness.  No sensory loss. Psychiatric: Mood and affect symmetric and appropriate. Eyes: No icterus.  No conjunctival pallor. Ears, nose, throat: mucous membranes moist.  Midline trachea.  Cardiac: regular rate and rhythm.  Respiratory: Unlabored Abdominal: soft, non-tender, non-distended.  Peripheral vascular:  2+ radial pulse on the left  2+ brachial pulse on the left  Left brachio-basilic AVF with strong thrill Extremity: No edema.  No cyanosis.  No pallor.  Skin: No gangrene.  No ulceration.  Lymphatic: No Stemmer's sign.  No palpable lymphadenopathy.  DATA REVIEW:    Most recent CBC CBC Latest Ref Rng & Units 09/11/2020 08/08/2020 04/01/2014  WBC 3.9 - 10.3 10e3/uL - - 8.8  Hemoglobin 12.0 - 15.0 g/dL 10.9(L) 8.5(L) 12.9  Hematocrit 36.0 - 46.0 % 32.0(L) 25.0(L) 38.3  Platelets 145 - 400 10e3/uL - - 247     Most recent CMP CMP Latest Ref Rng & Units 09/11/2020 08/08/2020 12/31/2013  Glucose 70 - 99 mg/dL 141(H) 82 134  BUN 8 - 23 mg/dL 37(H) 51(H) 29.2(H)  Creatinine 0.44 - 1.00 mg/dL 3.30(H) 13.00(H) 1.8(H)  Sodium  135 - 145 mmol/L 145 137 145  Potassium 3.5 - 5.1 mmol/L 3.4(L) 5.1 4.9  Chloride 98 - 111 mmol/L 117(H) 110 -  CO2 22 - 29 mEq/L - - 22  Calcium 8.4 - 10.4 mg/dL - - 10.0  Total Protein 6.4 - 8.3 g/dL - - 7.0  Total Bilirubin 0.20 - 1.20 mg/dL - - 0.25  Alkaline Phos 40 - 150 U/L - - 70  AST 5 - 34 U/L - - 12  ALT 0 - 55 U/L - - 12     Timica Marcom N. Stanford Breed, MD [facility] Phone Number: [phone] 10/28/2020 4:41 PM

## 2020-10-28 NOTE — H&P (View-Only) (Signed)
ASSESSMENT & PLAN:  75 y.o. female with CKDV nearing ESRD.  I spent greater than 15 minutes explaining hemodialysis, and the specifics of dialysis access.  I counseled her that all dialysis access requires maintenance.  I explained the rationale for using autogenous tissue when possible.  I counseled her about the risks of steal syndrome.  She understood and wished to proceed with HD access creation.  Status post left brachio-basilic arteriovenous fistula creation 09/11/20. This needs to be transposed. I offered her next available, but she prefers 10/31/20.  CHIEF COMPLAINT:   CKD5  HISTORY:  HISTORY OF PRESENT ILLNESS: [name] is a 76 y.o. female who presents to clinic for evaluation of dialysis access.  She is a right-handed woman.  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  She is not sure exactly when she will need this but her impression is that this is several months away.  She is an incredibly pleasant woman who has multiple excellent questions about dialysis access today.  09/09/20: Patient presents to clinic for postoperative check. She reports bruising and swelling below her incision occurring shortly after surgery. She does not have a thrill in her AVF.   10/28/20: Returns for post op evaluation. She is very close to starting dialysis. She is quite symptomatic from uremia and reports daily nausea and morning emesis.   Past Medical History:  Diagnosis Date  . Anemia, iron deficiency   . Asthma    as a child  . Cancer ([place]) 03/2020   skin cancer - bil legs  . Carpal tunnel syndrome    Left hand   . CKD (chronic kidney disease)   . Depression   . 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 Gastritis   . H/O: GI bleed 09/2008  . Heart murmur    Never was sent to cardiologist  . Hemorrhoids, external   . History of blood transfusion    bleeding Hemorroids  . HTN (hypertension)   . Hyperlipidemia   . Hypothyroidism   . OA (osteoarthritis)   . Obesity    . Pagophagia   . Pneumonia   . Sleep apnea, obstructive    Does not use oxygen at bedtime   . Vitamin B12 deficiency     Past Surgical History:  Procedure Laterality Date  . ABDOMINAL HYSTERECTOMY    . ADENOIDECTOMY    . APPENDECTOMY    . AV FISTULA PLACEMENT Left 08/08/2020   Procedure: LEFT UPPER EXTREMITY ARTERIOVENOUS (AV) FISTULA CREATION;  Surgeon: Cherre Robins, MD;  Location: [place];  Service: Vascular;  Laterality: Left;  . AV FISTULA PLACEMENT Left 09/11/2020   Procedure: LEFT UPPER EXTREMITY BRACHIOCEPHALIC FISTULA  CREATION;  Surgeon: Cherre Robins, MD;  Location: [place];  Service: Vascular;  Laterality: Left;  . COLONOSCOPY    . ESOPHAGOGASTRODUODENOSCOPY    . HEMATOMA EVACUATION Left 09/11/2020   Procedure: EVACUATION HEMATOMA;  Surgeon: Cherre Robins, MD;  Location: [place];  Service: Vascular;  Laterality: Left;  . HEMORRHOID SURGERY    . TONSILLECTOMY      Family History  Problem Relation Age of Onset  . Hypertension Son   . Hemochromatosis Son   . Fibromyalgia Daughter     Social History   Socioeconomic History  . Marital status: Divorced    Spouse name: Not on file  . Number of children: Not on file  . Years of education: Not on file  .  Highest education level: Not on file  Occupational History  . Not on file  Tobacco Use  . Smoking status: Never Smoker  . Smokeless tobacco: Never Used  Vaping Use  . Vaping Use: Never used  Substance and Sexual Activity  . Alcohol use: No  . Drug use: No  . Sexual activity: Not on file  Other Topics Concern  . Not on file  Social History Narrative  . Not on file   Social Determinants of Health   Financial Resource Strain: Not on file  Food Insecurity: Not on file  Transportation Needs: Not on file  Physical Activity: Not on file  Stress: Not on file  Social Connections: Not on file  Intimate Partner Violence: Not on file    Allergies  Allergen Reactions  . Tape Anaphylaxis    Adhesive tape  causes blisters  . Other Other (See Comments)    Dust and feathers/asthma    Current Outpatient Medications  Medication Sig Dispense Refill  . acetaminophen (TYLENOL) 650 MG CR tablet Take 1,300 mg by mouth 2 (two) times daily.    Marland Kitchen amLODipine (NORVASC) 10 MG tablet Take 10 mg by mouth daily.    Marland Kitchen atorvastatin (LIPITOR) 20 MG tablet Take 20 mg by mouth daily.    . betamethasone dipropionate 0.05 % cream Apply 1 application topically 2 (two) times daily as needed (rash).    . diphenhydrAMINE (BENADRYL) 25 MG tablet Take 25 mg by mouth every 6 (six) hours as needed for allergies.    Marland Kitchen FLUoxetine (PROZAC) 40 MG capsule Take 40 mg by mouth at bedtime.    . Homeopathic Products (LEG CRAMPS PO) Take 4-6 tablets by mouth daily as needed (leg cramps).    . hydrALAZINE (APRESOLINE) 100 MG tablet Take 100 mg by mouth 3 (three) times daily.    Marland Kitchen levothyroxine (SYNTHROID, LEVOTHROID) 100 MCG tablet Take 100 mcg by mouth daily.    . Multiple Vitamins-Minerals (AIRBORNE GUMMIES) CHEW Chew 2 tablets by mouth daily.    Marland Kitchen MYRBETRIQ 50 MG TB24 tablet Take 50 mg by mouth daily.    Marland Kitchen oxybutynin (DITROPAN-XL) 10 MG 24 hr tablet Take 10 mg by mouth daily.    Marland Kitchen senna-docusate (SENOKOT-S) 8.6-50 MG tablet Take 8-10 tablets by mouth at bedtime.    . vitamin B-12 (CYANOCOBALAMIN) 1000 MCG tablet Take 1,000 mcg by mouth 4 (four) times a week.      No current facility-administered medications for this visit.    REVIEW OF SYSTEMS:  [X]  denotes positive finding, [ ]  denotes negative finding Cardiac  Comments:  Chest pain or chest pressure:    Shortness of breath upon exertion:    Short of breath when lying flat:    Irregular heart rhythm:        Vascular    Pain in calf, thigh, or hip brought on by ambulation:    Pain in feet at night that wakes you up from your sleep:     Blood clot in your veins:    Leg swelling:  x       Pulmonary    Oxygen at home:    Productive cough:     Wheezing:          Neurologic    Sudden weakness in arms or legs:     Sudden numbness in arms or legs:     Sudden onset of difficulty speaking or slurred speech:    Temporary loss of vision in one eye:  Problems with dizziness:         Gastrointestinal    Blood in stool:     Vomited blood:         Genitourinary    Burning when urinating:     Blood in urine:        Psychiatric    Major depression:         Hematologic    Bleeding problems:    Problems with blood clotting too easily:        Skin    Rashes or ulcers:        Constitutional    Fever or chills:     PHYSICAL EXAM:   Vitals:   10/28/20 1533  BP: (!) 169/73  Pulse: 67  Resp: 20  Temp: 98.1 F (36.7 C)  SpO2: 95%  Weight: 232 lb (105.2 kg)  Height: 5\' 9"  (1.753 m)    Constitutional: Well appearing in no distress. Appears well nourished.  Neurologic: Normal gait and station. CN intact.  No weakness.  No sensory loss. Psychiatric: Mood and affect symmetric and appropriate. Eyes: No icterus.  No conjunctival pallor. Ears, nose, throat: mucous membranes moist.  Midline trachea.  Cardiac: regular rate and rhythm.  Respiratory: Unlabored Abdominal: soft, non-tender, non-distended.  Peripheral vascular:  2+ radial pulse on the left  2+ brachial pulse on the left  Left brachio-basilic AVF with strong thrill Extremity: No edema.  No cyanosis.  No pallor.  Skin: No gangrene.  No ulceration.  Lymphatic: No Stemmer's sign.  No palpable lymphadenopathy.  DATA REVIEW:    Most recent CBC CBC Latest Ref Rng & Units 09/11/2020 08/08/2020 04/01/2014  WBC 3.9 - 10.3 10e3/uL - - 8.8  Hemoglobin 12.0 - 15.0 g/dL 10.9(L) 8.5(L) 12.9  Hematocrit 36.0 - 46.0 % 32.0(L) 25.0(L) 38.3  Platelets 145 - 400 10e3/uL - - 247     Most recent CMP CMP Latest Ref Rng & Units 09/11/2020 08/08/2020 12/31/2013  Glucose 70 - 99 mg/dL 141(H) 82 134  BUN 8 - 23 mg/dL 37(H) 51(H) 29.2(H)  Creatinine 0.44 - 1.00 mg/dL 3.30(H) 13.00(H) 1.8(H)  Sodium  135 - 145 mmol/L 145 137 145  Potassium 3.5 - 5.1 mmol/L 3.4(L) 5.1 4.9  Chloride 98 - 111 mmol/L 117(H) 110 -  CO2 22 - 29 mEq/L - - 22  Calcium 8.4 - 10.4 mg/dL - - 10.0  Total Protein 6.4 - 8.3 g/dL - - 7.0  Total Bilirubin 0.20 - 1.20 mg/dL - - 0.25  Alkaline Phos 40 - 150 U/L - - 70  AST 5 - 34 U/L - - 12  ALT 0 - 55 U/L - - 12     Mariajose Mow N. Stanford Breed, MD [facility] Phone Number: [phone] 10/28/2020 4:41 PM

## 2020-11-06 ENCOUNTER — Encounter (HOSPITAL_COMMUNITY): Payer: Self-pay | Admitting: *Deleted

## 2020-11-06 ENCOUNTER — Other Ambulatory Visit (HOSPITAL_COMMUNITY)
Admission: RE | Admit: 2020-11-06 | Discharge: 2020-11-06 | Disposition: A | Discharge status: Home / Self Care | Payer: Medicare Other | Source: Ambulatory Visit | Attending: Vascular Surgery | Admitting: Vascular Surgery

## 2020-11-06 DIAGNOSIS — Z20822 Contact with and (suspected) exposure to covid-19: Secondary | ICD-10-CM | POA: Diagnosis not present

## 2020-11-06 DIAGNOSIS — Z01812 Encounter for preprocedural laboratory examination: Secondary | ICD-10-CM | POA: Diagnosis present

## 2020-11-06 LAB — SARS CORONAVIRUS 2 (TAT 6-24 HRS): SARS Coronavirus 2: NEGATIVE

## 2020-11-06 NOTE — Anesthesia Preprocedure Evaluation (Addendum)
Anesthesia Evaluation  Patient identified by MRN, date of birth, ID band Patient awake    Reviewed: Allergy & Precautions, NPO status , Patient's Chart, lab work & pertinent test results  Airway Mallampati: I  TM Distance: >3 FB Neck ROM: Full    Dental no notable dental hx. (+) Teeth Intact, Dental Advisory Given   Pulmonary asthma , sleep apnea , pneumonia,    Pulmonary exam normal breath sounds clear to auscultation       Cardiovascular hypertension, Pt. on medications + Peripheral Vascular Disease  Normal cardiovascular exam Rhythm:Regular Rate:Normal     Neuro/Psych PSYCHIATRIC DISORDERS Anxiety Depression  Neuromuscular disease    GI/Hepatic negative GI ROS,   Endo/Other  diabetes, Type 2Hypothyroidism Morbid obesity  Renal/GU CRFRenal disease     Musculoskeletal  (+) Arthritis , Osteoarthritis,    Abdominal   Peds  Hematology  (+) Blood dyscrasia, anemia ,   Anesthesia Other Findings   Reproductive/Obstetrics                            Anesthesia Physical  Anesthesia Plan  ASA: III  Anesthesia Plan: General   Post-op Pain Management:    Induction: Intravenous  PONV Risk Score and Plan: 3 and Ondansetron, Dexamethasone and Midazolam  Airway Management Planned: LMA  Additional Equipment:   Intra-op Plan:   Post-operative Plan: Extubation in OR  Informed Consent: I have reviewed the patients History and Physical, chart, labs and discussed the procedure including the risks, benefits and alternatives for the proposed anesthesia with the patient or authorized representative who has indicated his/her understanding and acceptance.     Dental advisory given  Plan Discussed with: CRNA and Anesthesiologist  Anesthesia Plan Comments:         Anesthesia Quick Evaluation

## 2020-11-06 NOTE — Progress Notes (Signed)
Spoke with pt for pre-op call. Pt denies cardiac history. Pt states she is a type 2 diabetic. Does not check her blood sugar at home. Last A1C was 5.3 in January, 2022.   Covid test done today, result is pending.  Pt states she's been in quarantine since the test was done and understands that she stays in quarantine until she comes to the hospital in the morning.

## 2020-11-07 ENCOUNTER — Ambulatory Visit (HOSPITAL_COMMUNITY): Payer: Medicare Other | Admitting: Anesthesiology

## 2020-11-07 ENCOUNTER — Encounter (HOSPITAL_COMMUNITY)
Admission: RE | Disposition: A | Discharge status: Home / Self Care | Payer: Self-pay | Source: Home / Self Care | Attending: Vascular Surgery

## 2020-11-07 ENCOUNTER — Encounter (HOSPITAL_COMMUNITY): Payer: Self-pay

## 2020-11-07 ENCOUNTER — Ambulatory Visit (HOSPITAL_COMMUNITY)
Admission: RE | Admit: 2020-11-07 | Discharge: 2020-11-07 | Disposition: A | Discharge status: Home / Self Care | Payer: Medicare Other | Attending: Vascular Surgery | Admitting: Vascular Surgery

## 2020-11-07 ENCOUNTER — Other Ambulatory Visit: Payer: Self-pay

## 2020-11-07 DIAGNOSIS — I120 Hypertensive chronic kidney disease with stage 5 chronic kidney disease or end stage renal disease: Secondary | ICD-10-CM | POA: Insufficient documentation

## 2020-11-07 DIAGNOSIS — Z79890 Hormone replacement therapy (postmenopausal): Secondary | ICD-10-CM | POA: Insufficient documentation

## 2020-11-07 DIAGNOSIS — N185 Chronic kidney disease, stage 5: Secondary | ICD-10-CM | POA: Insufficient documentation

## 2020-11-07 DIAGNOSIS — Z79899 Other long term (current) drug therapy: Secondary | ICD-10-CM | POA: Diagnosis not present

## 2020-11-07 DIAGNOSIS — E1122 Type 2 diabetes mellitus with diabetic chronic kidney disease: Secondary | ICD-10-CM | POA: Diagnosis not present

## 2020-11-07 HISTORY — DX: COVID-19: U07.1

## 2020-11-07 HISTORY — PX: FISTULA SUPERFICIALIZATION: SHX6341

## 2020-11-07 LAB — GLUCOSE, CAPILLARY
Glucose-Capillary: 122 mg/dL — ABNORMAL HIGH (ref 70–99)
Glucose-Capillary: 142 mg/dL — ABNORMAL HIGH (ref 70–99)

## 2020-11-07 LAB — POCT I-STAT, CHEM 8
BUN: 36 mg/dL — ABNORMAL HIGH (ref 8–23)
Calcium, Ion: 1.22 mmol/L (ref 1.15–1.40)
Chloride: 110 mmol/L (ref 98–111)
Creatinine, Ser: 3.7 mg/dL — ABNORMAL HIGH (ref 0.44–1.00)
Glucose, Bld: 135 mg/dL — ABNORMAL HIGH (ref 70–99)
HCT: 30 % — ABNORMAL LOW (ref 36.0–46.0)
Hemoglobin: 10.2 g/dL — ABNORMAL LOW (ref 12.0–15.0)
Potassium: 3.8 mmol/L (ref 3.5–5.1)
Sodium: 142 mmol/L (ref 135–145)
TCO2: 20 mmol/L — ABNORMAL LOW (ref 22–32)

## 2020-11-07 SURGERY — FISTULA SUPERFICIALIZATION
Anesthesia: General | Laterality: Left

## 2020-11-07 MED ORDER — PROPOFOL 10 MG/ML IV BOLUS
INTRAVENOUS | Status: DC | PRN
  Administered 2020-11-07: 20 mg via INTRAVENOUS
  Administered 2020-11-07: 150 mg via INTRAVENOUS

## 2020-11-07 MED ORDER — BUPIVACAINE HCL (PF) 0.25 % IJ SOLN
INTRAMUSCULAR | Status: AC
  Filled 2020-11-07: qty 30

## 2020-11-07 MED ORDER — ACETAMINOPHEN 160 MG/5ML PO SOLN: 325.0000 mg | ORAL | Status: DC | PRN

## 2020-11-07 MED ORDER — FENTANYL CITRATE (PF) 100 MCG/2ML IJ SOLN
INTRAMUSCULAR | Status: AC
  Administered 2020-11-07: 25 ug via INTRAVENOUS
  Filled 2020-11-07: qty 2

## 2020-11-07 MED ORDER — SODIUM CHLORIDE 0.9 % IV SOLN
INTRAVENOUS | Status: AC
  Filled 2020-11-07: qty 1.2

## 2020-11-07 MED ORDER — PROPOFOL 10 MG/ML IV BOLUS
INTRAVENOUS | Status: AC
  Filled 2020-11-07: qty 20

## 2020-11-07 MED ORDER — OXYCODONE HCL 5 MG/5ML PO SOLN: 5.0000 mg | Freq: Once | ORAL | Status: AC | PRN

## 2020-11-07 MED ORDER — FENTANYL CITRATE (PF) 100 MCG/2ML IJ SOLN
INTRAMUSCULAR | Status: DC | PRN
  Administered 2020-11-07 (×2): 25 ug via INTRAVENOUS
  Administered 2020-11-07: 50 ug via INTRAVENOUS

## 2020-11-07 MED ORDER — OXYCODONE HCL 5 MG PO TABS
ORAL_TABLET | ORAL | Status: AC
  Administered 2020-11-07: 5 mg via ORAL
  Filled 2020-11-07: qty 1

## 2020-11-07 MED ORDER — SODIUM CHLORIDE 0.9 % IV SOLN: INTRAVENOUS | Status: DC

## 2020-11-07 MED ORDER — CHLORHEXIDINE GLUCONATE 4 % EX LIQD: 60.0000 mL | Freq: Once | CUTANEOUS | Status: DC

## 2020-11-07 MED ORDER — HEPARIN SODIUM (PORCINE) 1000 UNIT/ML IJ SOLN
INTRAMUSCULAR | Status: DC | PRN
  Administered 2020-11-07: 5000 [IU] via INTRAVENOUS

## 2020-11-07 MED ORDER — LIDOCAINE 2% (20 MG/ML) 5 ML SYRINGE
INTRAMUSCULAR | Status: DC | PRN
  Administered 2020-11-07: 80 mg via INTRAVENOUS

## 2020-11-07 MED ORDER — OXYCODONE HCL 5 MG PO TABS: 5.0000 mg | ORAL_TABLET | Freq: Once | ORAL | Status: AC | PRN

## 2020-11-07 MED ORDER — CEFAZOLIN SODIUM-DEXTROSE 2-4 GM/100ML-% IV SOLN
2.0000 g | INTRAVENOUS | Status: AC
  Administered 2020-11-07: 2 g via INTRAVENOUS
  Filled 2020-11-07: qty 100

## 2020-11-07 MED ORDER — DEXAMETHASONE SODIUM PHOSPHATE 10 MG/ML IJ SOLN
INTRAMUSCULAR | Status: DC | PRN
  Administered 2020-11-07: 10 mg via INTRAVENOUS

## 2020-11-07 MED ORDER — BUPIVACAINE HCL (PF) 0.25 % IJ SOLN
INTRAMUSCULAR | Status: DC | PRN
  Administered 2020-11-07: 30 mL

## 2020-11-07 MED ORDER — ACETAMINOPHEN 325 MG PO TABS
325.0000 mg | ORAL_TABLET | ORAL | Status: DC | PRN
Start: 2020-11-07 — End: 2020-11-07

## 2020-11-07 MED ORDER — FENTANYL CITRATE (PF) 100 MCG/2ML IJ SOLN
25.0000 ug | INTRAMUSCULAR | Status: DC | PRN
  Administered 2020-11-07 (×3): 25 ug via INTRAVENOUS
  Administered 2020-11-07: 50 ug via INTRAVENOUS

## 2020-11-07 MED ORDER — ORAL CARE MOUTH RINSE: 15.0000 mL | Freq: Once | OROMUCOSAL | Status: AC

## 2020-11-07 MED ORDER — LIDOCAINE HCL 1 % IJ SOLN
INTRAMUSCULAR | Status: AC
  Filled 2020-11-07: qty 20

## 2020-11-07 MED ORDER — HYDROCODONE-ACETAMINOPHEN 5-325 MG PO TABS: 1.0000 | ORAL_TABLET | Freq: Four times a day (QID) | ORAL | 0 refills | Status: AC | PRN

## 2020-11-07 MED ORDER — ONDANSETRON HCL 4 MG/2ML IJ SOLN: 4.0000 mg | Freq: Once | INTRAMUSCULAR | Status: DC | PRN

## 2020-11-07 MED ORDER — FENTANYL CITRATE (PF) 100 MCG/2ML IJ SOLN
INTRAMUSCULAR | Status: AC
  Filled 2020-11-07: qty 2

## 2020-11-07 MED ORDER — PHENYLEPHRINE HCL (PRESSORS) 10 MG/ML IV SOLN
INTRAVENOUS | Status: DC | PRN
  Administered 2020-11-07: 80 ug via INTRAVENOUS

## 2020-11-07 MED ORDER — ONDANSETRON HCL 4 MG/2ML IJ SOLN
INTRAMUSCULAR | Status: DC | PRN
  Administered 2020-11-07: 4 mg via INTRAVENOUS

## 2020-11-07 MED ORDER — 0.9 % SODIUM CHLORIDE (POUR BTL) OPTIME
TOPICAL | Status: DC | PRN
  Administered 2020-11-07: 1000 mL

## 2020-11-07 MED ORDER — PHENYLEPHRINE 40 MCG/ML (10ML) SYRINGE FOR IV PUSH (FOR BLOOD PRESSURE SUPPORT)
PREFILLED_SYRINGE | INTRAVENOUS | Status: AC
  Filled 2020-11-07: qty 10

## 2020-11-07 MED ORDER — CHLORHEXIDINE GLUCONATE 0.12 % MT SOLN
15.0000 mL | Freq: Once | OROMUCOSAL | Status: AC
  Administered 2020-11-07: 15 mL via OROMUCOSAL
  Filled 2020-11-07: qty 15

## 2020-11-07 MED ORDER — MEPERIDINE HCL 25 MG/ML IJ SOLN: 6.2500 mg | INTRAMUSCULAR | Status: DC | PRN

## 2020-11-07 MED ORDER — SODIUM CHLORIDE 0.9 % IV SOLN
INTRAVENOUS | Status: DC | PRN
  Administered 2020-11-07: 25 ug/min via INTRAVENOUS

## 2020-11-07 MED ORDER — FENTANYL CITRATE (PF) 250 MCG/5ML IJ SOLN
INTRAMUSCULAR | Status: AC
  Filled 2020-11-07: qty 5

## 2020-11-07 MED ORDER — LIDOCAINE-EPINEPHRINE (PF) 1 %-1:200000 IJ SOLN
INTRAMUSCULAR | Status: AC
  Filled 2020-11-07: qty 30

## 2020-11-07 SURGICAL SUPPLY — 38 items
ADH SKN CLS LQ APL DERMABOND (GAUZE/BANDAGES/DRESSINGS) ×3
APL PRP STRL LF DISP 70% ISPRP (MISCELLANEOUS) ×1
APL SKNCLS STERI-STRIP NONHPOA (GAUZE/BANDAGES/DRESSINGS) ×1
ARMBAND PINK RESTRICT EXTREMIT (MISCELLANEOUS) ×2 IMPLANT
BENZOIN TINCTURE PRP APPL 2/3 (GAUZE/BANDAGES/DRESSINGS) ×2 IMPLANT
BLADE HEX COATED 2.75 (ELECTRODE) ×1 IMPLANT
CANISTER SUCT 3000ML PPV (MISCELLANEOUS) ×2 IMPLANT
CANNULA VESSEL 3MM 2 BLNT TIP (CANNULA) ×2 IMPLANT
CHLORAPREP W/TINT 26 (MISCELLANEOUS) ×2 IMPLANT
CLIP VESOCCLUDE MED 6/CT (CLIP) ×2 IMPLANT
CLIP VESOCCLUDE SM WIDE 6/CT (CLIP) ×2 IMPLANT
COVER PROBE W GEL 5X96 (DRAPES) ×1 IMPLANT
COVER WAND RF STERILE (DRAPES) ×2 IMPLANT
DERMABOND ADHESIVE PROPEN (GAUZE/BANDAGES/DRESSINGS) ×3
DERMABOND ADVANCED .7 DNX6 (GAUZE/BANDAGES/DRESSINGS) IMPLANT
ELECT REM PT RETURN 9FT ADLT (ELECTROSURGICAL) ×2
ELECTRODE REM PT RTRN 9FT ADLT (ELECTROSURGICAL) ×1 IMPLANT
GLOVE SURG SS PI 8.0 STRL IVOR (GLOVE) ×2 IMPLANT
GOWN STRL REUS W/ TWL LRG LVL3 (GOWN DISPOSABLE) ×2 IMPLANT
GOWN STRL REUS W/ TWL XL LVL3 (GOWN DISPOSABLE) ×1 IMPLANT
GOWN STRL REUS W/TWL LRG LVL3 (GOWN DISPOSABLE) ×6
GOWN STRL REUS W/TWL XL LVL3 (GOWN DISPOSABLE)
INSERT FOGARTY SM (MISCELLANEOUS) IMPLANT
KIT BASIN OR (CUSTOM PROCEDURE TRAY) ×2 IMPLANT
KIT TURNOVER KIT B (KITS) ×2 IMPLANT
NS IRRIG 1000ML POUR BTL (IV SOLUTION) ×2 IMPLANT
PACK CV ACCESS (CUSTOM PROCEDURE TRAY) ×2 IMPLANT
PAD ARMBOARD 7.5X6 YLW CONV (MISCELLANEOUS) ×3 IMPLANT
PENCIL BUTTON HOLSTER BLD 10FT (ELECTRODE) ×1 IMPLANT
STRIP CLOSURE SKIN 1/2X4 (GAUZE/BANDAGES/DRESSINGS) ×2 IMPLANT
SUT MNCRL AB 4-0 PS2 18 (SUTURE) ×3 IMPLANT
SUT PROLENE 6 0 BV (SUTURE) ×2 IMPLANT
SUT SILK 3 0 SH CR/8 (SUTURE) ×1 IMPLANT
SUT VIC AB 3-0 SH 27 (SUTURE) ×6
SUT VIC AB 3-0 SH 27X BRD (SUTURE) ×1 IMPLANT
TOWEL GREEN STERILE (TOWEL DISPOSABLE) ×2 IMPLANT
UNDERPAD 30X36 HEAVY ABSORB (UNDERPADS AND DIAPERS) ×2 IMPLANT
WATER STERILE IRR 1000ML POUR (IV SOLUTION) ×2 IMPLANT

## 2020-11-07 NOTE — Transfer of Care (Signed)
Immediate Anesthesia Transfer of Care Note  Patient: [name]  Procedure(s) Performed: FISTULA SUPERFICIALIZATION LEFT UPPER EXTREMITY (Left )  Patient Location: PACU  Anesthesia Type:General  Level of Consciousness: awake, alert  and oriented  Airway & Oxygen Therapy: Patient Spontanous Breathing  Post-op Assessment: Post -op Vital signs reviewed and stable  Post vital signs: stable  Last Vitals:  Vitals Value Taken Time  BP    Temp    Pulse 75 11/07/20 0930  Resp 8 11/07/20 0930  SpO2 92 % 11/07/20 0930  Vitals shown include unvalidated device data.  Last Pain:  Vitals:   11/07/20 0641  TempSrc:   PainSc: 9       Patients Stated Pain Goal: 5 (44/65/20 7619)  Complications: No complications documented.

## 2020-11-07 NOTE — Op Note (Signed)
DATE OF SERVICE: 11/07/2020  PATIENT:  [name]  75 y.o. female  PRE-OPERATIVE DIAGNOSIS:  CKD V nearing ESRD  POST-OPERATIVE DIAGNOSIS:  Same  PROCEDURE:   Left upper extremity second stage basilic vein transposition  SURGEON:  Surgeon(s) and Role:    * Cherre Robins, MD - Primary  ASSISTANT: Arlee Muslim, PA-C  An assistant was required to facilitate exposure and expedite the case.  ANESTHESIA:   local and general  EBL: <61mL  BLOOD ADMINISTERED:none  DRAINS: none   LOCAL MEDICATIONS USED:  MARCAINE     SPECIMEN:  none  COUNTS: confirmed correct.  TOURNIQUET:  none  PATIENT DISPOSITION:  PACU - hemodynamically stable.   Delay start of Pharmacological VTE agent (>24hrs) due to surgical blood loss or risk of bleeding: no  INDICATION FOR PROCEDURE: [name] is a 75 y.o. female with CKD V nearing ESRD. She underwent left first stage basilic vein transposition which has matured. After careful discussion of risks, benefits, and alternatives the patient was offered second stage basilic vein transposition. The patient  understood and wished to proceed.  OPERATIVE FINDINGS: healthy AVF. Palpable subcutaneous thrill at completion. Weakly palpable thrill at axilla on completion. 2+ radial pulse.  DESCRIPTION OF PROCEDURE: After identification of the patient in the pre-operative holding area, the patient was transferred to the operating room. The patient was positioned supine on the operating room table. Anesthesia was induced. The left arm was prepped and draped in standard fashion. A surgical pause was performed confirming correct patient, procedure, and operative location.  Using intraoperative ultrasound, the course of the left upper extremity brachiobasilic AV fistula was marked on the skin.  2 longitudinal incisions were used to expose the fistula in its course.  Incisions were carried down through subcutaneous tissue, through the basilic fascia until the  fistula was identified.  The fistula was skeletonized from the axilla to the anastomosis.  The subcutaneous nerve overlying the fistula was spared.  A subcutaneous tunnel over the biceps was created with a gently curved sheath tunneler.  Patient was heparinized with 5000 units of IV heparin.  A Gregory clamp was applied to the proximal fistula and distal fistula.  The fistula was transected on a bevel near the anastomosis.  The fistula was marked with a skin marker to ensure accurate delivery through the tunneler.  The fistula was delivered through the sheath of the tunneler taking great care to avoid kinking or twisting.  The central clamp was then repositioned to the exit site.  The fistula was sewn end-to-end using 6-0 Prolene suture.  Prior to completion, the fistula was flushed and de-aired.  Upon completion hemostasis was achieved.  Hemostasis was then achieved in the surgical bed.  Palpable thrill was noted over the biceps.  A weakly palpable thrill was felt at the axilla.  2+ radial pulse was noted.  The wound was closed in layers using 3-0 Vicryl and 4-0 Monocryl.  Dermabond was applied.  Cutaneous Marcaine was injected to assist with pain control postoperatively.  Upon completion of the case instrument and sharps counts were confirmed correct. The patient was transferred to the PACU in good condition. I was present for all portions of the procedure.  Yevonne Aline. Stanford Breed, MD [facility] Phone Number: [phone] 11/07/2020 9:19 AM

## 2020-11-07 NOTE — Anesthesia Postprocedure Evaluation (Signed)
Anesthesia Post Note  Patient: [name]  Procedure(s) Performed: FISTULA SUPERFICIALIZATION LEFT UPPER EXTREMITY (Left )     Patient location during evaluation: PACU Anesthesia Type: General Level of consciousness: awake and alert Pain management: pain level controlled Vital Signs Assessment: post-procedure vital signs reviewed and stable Respiratory status: spontaneous breathing, nonlabored ventilation, respiratory function stable and patient connected to nasal cannula oxygen Cardiovascular status: blood pressure returned to baseline and stable Postop Assessment: no apparent nausea or vomiting Anesthetic complications: no   No complications documented.  Last Vitals:  Vitals:   11/07/20 1030 11/07/20 1042  BP: (!) 154/67   Pulse: 65   Resp: 15   Temp:  37 C  SpO2: 96%     Last Pain:  Vitals:   11/07/20 1030  TempSrc:   PainSc: 3                  Jeremian Whitby

## 2020-11-07 NOTE — Interval H&P Note (Signed)
History and Physical Interval Note:  11/07/2020 7:12 AM  [name]  has presented today for surgery, with the diagnosis of CKD.  The various methods of treatment have been discussed with the patient and family. After consideration of risks, benefits and other options for treatment, the patient has consented to  Procedure(s): REVISON OF LEFT UPPER EXTREMITY ARTERIOVENOUS FISTULA (Left) as a surgical intervention.  The patient's history has been reviewed, patient examined, no change in status, stable for surgery.  I have reviewed the patient's chart and labs.  Questions were answered to the patient's satisfaction.     Cherre Robins

## 2020-11-07 NOTE — Anesthesia Procedure Notes (Signed)
Procedure Name: LMA Insertion Date/Time: 11/07/2020 7:40 AM Performed by: Lavell Luster, CRNA Pre-anesthesia Checklist: Patient identified, Emergency Drugs available, Suction available, Patient being monitored and Timeout performed Patient Re-evaluated:Patient Re-evaluated prior to induction Oxygen Delivery Method: Circle system utilized Preoxygenation: Pre-oxygenation with 100% oxygen Induction Type: IV induction Ventilation: Mask ventilation without difficulty LMA: LMA inserted LMA Size: 4.0 Number of attempts: 1 Placement Confirmation: breath sounds checked- equal and bilateral and positive ETCO2 Tube secured with: Tape Dental Injury: Teeth and Oropharynx as per pre-operative assessment

## 2020-11-07 NOTE — Discharge Instructions (Signed)
° °  [facility] ° °Discharge Instructions ° °AV Fistula or Graft Surgery for Dialysis Access ° °Please refer to the following instructions for your post-procedure care. Your surgeon or physician assistant will discuss any changes with you. ° °Activity ° °You may drive the day following your surgery, if you are comfortable and no longer taking prescription pain medication. Resume full activity as the soreness in your incision resolves. ° °Bathing/Showering ° °You may shower after you go home. Keep your incision dry for 48 hours. Do not soak in a bathtub, hot tub, or swim until the incision heals completely. You may not shower if you have a hemodialysis catheter. ° °Incision Care ° °Clean your incision with mild soap and water after 48 hours. Pat the area dry with a clean towel. You do not need a bandage unless otherwise instructed. Do not apply any ointments or creams to your incision. You may have skin glue on your incision. Do not peel it off. It will come off on its own in about one week. Your arm may swell a bit after surgery. To reduce swelling use pillows to elevate your arm so it is above your heart. Your doctor will tell you if you need to lightly wrap your arm with an ACE bandage. ° °Diet ° °Resume your normal diet. There are not special food restrictions following this procedure. In order to heal from your surgery, it is CRITICAL to get adequate nutrition. Your body requires vitamins, minerals, and protein. Vegetables are the best source of vitamins and minerals. Vegetables also provide the perfect balance of protein. Processed food has little nutritional value, so try to avoid this. ° °Medications ° °Resume taking all of your medications. If your incision is causing pain, you may take over-the counter pain relievers such as acetaminophen (Tylenol). If you were prescribed a stronger pain medication, please be aware these medications can cause nausea and constipation. Prevent  nausea by taking the medication with a snack or meal. Avoid constipation by drinking plenty of fluids and eating foods with high amount of fiber, such as fruits, vegetables, and grains. Do not take Tylenol if you are taking prescription pain medications. ° ° ° ° °Follow up °Your surgeon may want to see you in the office following your access surgery. If so, this will be arranged at the time of your surgery. ° °Please call us immediately for any of the following conditions: ° °Increased pain, redness, drainage (pus) from your incision site °Fever of 101 degrees or higher °Severe or worsening pain at your incision site °Hand pain or numbness. ° °Reduce your risk of vascular disease: ° °Stop smoking. If you would like help, call [facility] at 1-800-QUIT-NOW (1-800-784-8669) or [facility] at 336-586-4000 ° °Manage your cholesterol °Maintain a desired weight °Control your diabetes °Keep your blood pressure down ° °Dialysis ° °It will take several weeks to several months for your new dialysis access to be ready for use. Your surgeon will determine when it is OK to use it. Your nephrologist will continue to direct your dialysis. You can continue to use your Permcath until your new access is ready for use. ° °If you have any questions, please call the office at [phone]. ° °

## 2020-11-08 ENCOUNTER — Encounter (HOSPITAL_COMMUNITY): Payer: Self-pay | Admitting: Vascular Surgery

## 2020-11-10 ENCOUNTER — Other Ambulatory Visit: Payer: Self-pay | Admitting: *Deleted

## 2020-11-10 ENCOUNTER — Other Ambulatory Visit: Payer: Self-pay

## 2020-11-10 DIAGNOSIS — N185 Chronic kidney disease, stage 5: Secondary | ICD-10-CM

## 2020-11-10 DIAGNOSIS — D631 Anemia in chronic kidney disease: Secondary | ICD-10-CM | POA: Insufficient documentation

## 2020-11-10 DIAGNOSIS — I129 Hypertensive chronic kidney disease with stage 1 through stage 4 chronic kidney disease, or unspecified chronic kidney disease: Secondary | ICD-10-CM | POA: Insufficient documentation

## 2020-11-10 DIAGNOSIS — E669 Obesity, unspecified: Secondary | ICD-10-CM | POA: Insufficient documentation

## 2020-11-25 ENCOUNTER — Encounter: Payer: Self-pay | Admitting: Physician Assistant

## 2020-11-25 ENCOUNTER — Other Ambulatory Visit: Payer: Self-pay

## 2020-11-25 ENCOUNTER — Ambulatory Visit (HOSPITAL_COMMUNITY)
Admission: RE | Admit: 2020-11-25 | Discharge: 2020-11-25 | Disposition: A | Discharge status: Home / Self Care | Payer: Medicare Other | Source: Ambulatory Visit | Attending: Vascular Surgery | Admitting: Vascular Surgery

## 2020-11-25 ENCOUNTER — Ambulatory Visit (INDEPENDENT_AMBULATORY_CARE_PROVIDER_SITE_OTHER): Payer: Medicare Other | Admitting: Physician Assistant

## 2020-11-25 VITALS — BP 141/63 | HR 74 | Temp 98.4°F | Resp 20 | Ht 69.0 in | Wt 220.7 lb

## 2020-11-25 DIAGNOSIS — N185 Chronic kidney disease, stage 5: Secondary | ICD-10-CM | POA: Insufficient documentation

## 2020-11-25 NOTE — Progress Notes (Signed)
POST OPERATIVE OFFICE NOTE    CC:  F/u for surgery  HPI:  This is a 75 y.o. 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  She is not on HD currently.  The first stage was performed on 08/08/20.    Pt returns today for follow up.  Pt states she has no pain, weakness or loss of sensation in the left hand.    Allergies  Allergen Reactions  . Tape Anaphylaxis    Adhesive tape causes blisters  . Other Other (See Comments)    Dust and feathers/asthma    Current Outpatient Medications  Medication Sig Dispense Refill  . acetaminophen (TYLENOL) 650 MG CR tablet Take 1,300 mg by mouth 2 (two) times daily.    Marland Kitchen amLODipine (NORVASC) 10 MG tablet Take 10 mg by mouth daily.    Marland Kitchen atorvastatin (LIPITOR) 20 MG tablet Take 20 mg by mouth daily.    . betamethasone dipropionate 0.05 % cream Apply 1 application topically 2 (two) times daily as needed (rash).    . diphenhydrAMINE (BENADRYL) 25 MG tablet Take 25 mg by mouth daily.    Marland Kitchen FLUoxetine (PROZAC) 40 MG capsule Take 40 mg by mouth at bedtime.    . Homeopathic Products (LEG CRAMPS PO) Take 4-6 tablets by mouth daily as needed (leg cramps).    . hydrALAZINE (APRESOLINE) 100 MG tablet Take 100 mg by mouth 3 (three) times daily.    Marland Kitchen HYDROcodone-acetaminophen (NORCO) 5-325 MG tablet Take 1 tablet by mouth every 6 (six) hours as needed for moderate pain. 20 tablet 0  . levothyroxine (SYNTHROID, LEVOTHROID) 100 MCG tablet Take 100 mcg by mouth daily.    . Multiple Vitamins-Minerals (AIRBORNE GUMMIES) CHEW Chew 2 tablets by mouth daily.    . Multiple Vitamins-Minerals (EMERGEN-C IMMUNE) PACK Take 1 Package by mouth every evening.    Marland Kitchen MYRBETRIQ 50 MG TB24 tablet Take 50 mg by mouth daily.    . Naphazoline-Glycerin (CLEAR EYES MAX REDNESS RELIEF) 0.03-0.5 % SOLN Apply 1-2 drops to eye 2 (two) times daily as needed (irritated eyes).    Marland Kitchen oxybutynin (DITROPAN-XL) 10 MG 24 hr tablet Take 10 mg by mouth  daily.    Marland Kitchen senna-docusate (SENOKOT-S) 8.6-50 MG tablet Take 6-10 tablets by mouth at bedtime.    . vitamin B-12 (CYANOCOBALAMIN) 1000 MCG tablet Take 1,000 mcg by mouth every other day.     No current facility-administered medications for this visit.     ROS:  See HPI  Physical Exam:  Findings:  +--------------------+----------+-----------------+--------+  AVF         PSV (cm/s)Flow Vol (mL/min)Comments  +--------------------+----------+-----------------+--------+  Native artery inflow  195      746          +--------------------+----------+-----------------+--------+  AVF Anastomosis     492                 +--------------------+----------+-----------------+--------+     +------------+----------+-------------+----------+--------+  OUTFLOW VEINPSV (cm/s)Diameter (cm)Depth (cm)Describe  +------------+----------+-------------+----------+--------+  Prox UA     348    0.72     0.66        +------------+----------+-------------+----------+--------+  Mid UA     128    0.72     0.36        +------------+----------+-------------+----------+--------+  Dist UA     514    0.69     0.49        +------------+----------+-------------+----------+--------+  Summary:  Patent left BVT status post superficialization.     Incision:  All incisions have healed well without erythema or drainage. Extremities:  Palpable radial pulse, motor and sensation intact.  Good palpable thrill throughout the fistula.    Assessment/Plan:  This is a 75 y.o. female who is s/p:Second stage basilic fistula transposition. CKD stage V.  The fistula may be accessed as of 12/05/20 if need be.  F/U PRN   Roxy Horseman PA-C [facility] [phone]   Clinic MD:  Stanford Breed

## 2021-03-27 DEATH — deceased
